# Patient Record
Sex: Male | Born: 1960 | Race: White | Hispanic: No | Smoking: Never smoker
Health system: Southern US, Community
[De-identification: ages and names within clinical notes are randomized; demographics above are authoritative.]

## PROBLEM LIST (undated history)

## (undated) DIAGNOSIS — E119 Type 2 diabetes mellitus without complications: Secondary | ICD-10-CM

## (undated) DIAGNOSIS — Z8673 Personal history of transient ischemic attack (TIA), and cerebral infarction without residual deficits: Secondary | ICD-10-CM

## (undated) DIAGNOSIS — R519 Headache, unspecified: Secondary | ICD-10-CM

## (undated) DIAGNOSIS — I10 Essential (primary) hypertension: Secondary | ICD-10-CM

## (undated) DIAGNOSIS — R51 Headache: Secondary | ICD-10-CM

## (undated) DIAGNOSIS — E782 Mixed hyperlipidemia: Secondary | ICD-10-CM

## (undated) HISTORY — DX: Essential (primary) hypertension: I10

## (undated) HISTORY — PX: CYSTOSCOPY WITH INSERTION OF UROLIFT: SHX6678

## (undated) HISTORY — PX: NO PAST SURGERIES: SHX2092

## (undated) HISTORY — DX: Mixed hyperlipidemia: E78.2

## (undated) HISTORY — PX: FOOT SURGERY: SHX648

## (undated) HISTORY — DX: Type 2 diabetes mellitus without complications: E11.9

## (undated) HISTORY — DX: Personal history of transient ischemic attack (TIA), and cerebral infarction without residual deficits: Z86.73

## (undated) HISTORY — PX: HERNIA REPAIR: SHX51

---

## 2015-07-31 NOTE — H&P (Signed)
  NTS SOAP Note  Vital Signs:  Vitals as of: 07/28/2015: Systolic 143: Diastolic 94: Heart Rate 73: Temp 98.2F (Temporal): Height 71ft 11in: Weight 189Lbs 0 Ounces: BMI 26.36   BMI : 26.36 kg/m2  Subjective: This 55 year old male presents for of need for screening TCS.  Never has had a colonoscopy.  Denies any lower gi complaints.  No family  h/o colon cancer.  Review of Symptoms:  Constitutional:unremarkable   Head:unremarkable Eyes:unremarkable   sinus problems Cardiovascular:  unremarkable Respiratory:unremarkable Gastrointestindyspepsia Genitourinary:unremarkable   joint, neck, and back pain Skin:unremarkable Hematolgic/Lymphatic:unremarkable   Allergic/Immunologic:unremarkable   Past Medical History:  Reviewed  Past Medical History  Surgical History: none Medical Problems: HTN Allergies: nkda Medications: bystolic, spironolactone   Social History:Reviewed  Social History  Preferred Language: English Race:  White Ethnicity: Not Hispanic / Latino Age: 61 year Marital Status:  M Alcohol: no   Smoking Status: Never smoker reviewed on 07/28/2015 Functional Status reviewed on 07/28/2015 ------------------------------------------------ Bathing: Normal Cooking: Normal Dressing: Normal Driving: Normal Eating: Normal Managing Meds: Normal Oral Care: Normal Shopping: Normal Toileting: Normal Transferring: Normal Walking: Normal Cognitive Status reviewed on 07/28/2015 ------------------------------------------------ Attention: Normal Decision Making: Normal Language: Normal Memory: Normal Motor: Normal Perception: Normal Problem Solving: Normal Visual and Spatial: Normal   Family History:Reviewed  Family Health History Mother, Deceased; Diabetes mellitus, unspecified type;  Father, Deceased; History Unknown    Objective Information: General:Well appearing, well nourished in no distress. Heart:RRR, no murmur or gallop.   Normal S1, S2.  No S3, S4.  Lungs:  CTA bilaterally, no wheezes, rhonchi, rales.  Breathing unlabored. Abdomen:Soft, NT/ND, no HSM, no masses. deferred to procedure  Assessment:Need for screening TCS  Diagnoses: V76.51  Z12.11 Screening for malignant neoplasm of colon (Encounter for screening for malignant neoplasm of colon)  Procedures: 14782 - OFFICE OUTPATIENT NEW 20 MINUTES    Plan:  Scheduled for TCS on 08/11/15.   Patient Education:Alternative treatments to surgery were discussed with patient (and family).  Risks and benefits  of procedure including bleeding and perforation were fully explained to the patient (and family) who gave informed consent. Patient/family questions were addressed.  Follow-up:Pending Surgery

## 2015-08-11 ENCOUNTER — Encounter (HOSPITAL_COMMUNITY): Payer: Self-pay | Admitting: *Deleted

## 2015-08-11 ENCOUNTER — Encounter (HOSPITAL_COMMUNITY)
Admission: RE | Disposition: A | Discharge status: Home / Self Care | Payer: Self-pay | Source: Ambulatory Visit | Attending: General Surgery

## 2015-08-11 ENCOUNTER — Ambulatory Visit (HOSPITAL_COMMUNITY)
Admission: RE | Admit: 2015-08-11 | Discharge: 2015-08-11 | Disposition: A | Discharge status: Home / Self Care | Payer: BLUE CROSS/BLUE SHIELD | Source: Ambulatory Visit | Attending: General Surgery | Admitting: General Surgery

## 2015-08-11 DIAGNOSIS — I10 Essential (primary) hypertension: Secondary | ICD-10-CM | POA: Insufficient documentation

## 2015-08-11 DIAGNOSIS — Z79899 Other long term (current) drug therapy: Secondary | ICD-10-CM | POA: Diagnosis not present

## 2015-08-11 DIAGNOSIS — Z1211 Encounter for screening for malignant neoplasm of colon: Secondary | ICD-10-CM | POA: Insufficient documentation

## 2015-08-11 HISTORY — DX: Essential (primary) hypertension: I10

## 2015-08-11 HISTORY — PX: COLONOSCOPY: SHX5424

## 2015-08-11 HISTORY — DX: Headache, unspecified: R51.9

## 2015-08-11 HISTORY — DX: Headache: R51

## 2015-08-11 SURGERY — COLONOSCOPY
Anesthesia: Moderate Sedation

## 2015-08-11 MED ORDER — MIDAZOLAM HCL 5 MG/5ML IJ SOLN
INTRAMUSCULAR | Status: DC | PRN
  Administered 2015-08-11: 3 mg via INTRAVENOUS
  Administered 2015-08-11: 1 mg via INTRAVENOUS

## 2015-08-11 MED ORDER — SIMETHICONE 40 MG/0.6ML PO SUSP
ORAL | Status: AC
  Filled 2015-08-11: qty 30

## 2015-08-11 MED ORDER — MIDAZOLAM HCL 5 MG/5ML IJ SOLN
INTRAMUSCULAR | Status: AC
  Filled 2015-08-11: qty 5

## 2015-08-11 MED ORDER — MEPERIDINE HCL 50 MG/ML IJ SOLN
INTRAMUSCULAR | Status: DC | PRN
  Administered 2015-08-11: 50 mg via INTRAVENOUS

## 2015-08-11 MED ORDER — MEPERIDINE HCL 50 MG/ML IJ SOLN
INTRAMUSCULAR | Status: AC
  Filled 2015-08-11: qty 1

## 2015-08-11 MED ORDER — SODIUM CHLORIDE 0.9 % IV SOLN
INTRAVENOUS | Status: DC
  Administered 2015-08-11: 07:00:00 via INTRAVENOUS

## 2015-08-11 MED ORDER — STERILE WATER FOR IRRIGATION IR SOLN
Status: DC | PRN
  Administered 2015-08-11: 07:00:00

## 2015-08-11 NOTE — Discharge Instructions (Signed)
Colonoscopy, Care After °Refer to this sheet in the next few weeks. These instructions provide you with information on caring for yourself after your procedure. Your health care provider may also give you more specific instructions. Your treatment has been planned according to current medical practices, but problems sometimes occur. Call your health care provider if you have any problems or questions after your procedure. °WHAT TO EXPECT AFTER THE PROCEDURE  °After your procedure, it is typical to have the following: °· A small amount of blood in your stool. °· Moderate amounts of gas and mild abdominal cramping or bloating. °HOME CARE INSTRUCTIONS °· Do not drive, operate machinery, or sign important documents for 24 hours. °· You may shower and resume your regular physical activities, but move at a slower pace for the first 24 hours. °· Take frequent rest periods for the first 24 hours. °· Walk around or put a warm pack on your abdomen to help reduce abdominal cramping and bloating. °· Drink enough fluids to keep your urine clear or pale yellow. °· You may resume your normal diet as instructed by your health care provider. Avoid heavy or fried foods that are hard to digest. °· Avoid drinking alcohol for 24 hours or as instructed by your health care provider. °· Only take over-the-counter or prescription medicines as directed by your health care provider. °· If a tissue sample (biopsy) was taken during your procedure: °¨ Do not take aspirin or blood thinners for 7 days, or as instructed by your health care provider. °¨ Do not drink alcohol for 7 days, or as instructed by your health care provider. °¨ Eat soft foods for the first 24 hours. °SEEK MEDICAL CARE IF: °You have persistent spotting of blood in your stool 2-3 days after the procedure. °SEEK IMMEDIATE MEDICAL CARE IF: °· You have more than a small spotting of blood in your stool. °· You pass large blood clots in your stool. °· Your abdomen is swollen  (distended). °· You have nausea or vomiting. °· You have a fever. °· You have increasing abdominal pain that is not relieved with medicine. °  °This information is not intended to replace advice given to you by your health care provider. Make sure you discuss any questions you have with your health care provider. °  °Document Released: 02/09/2004 Document Revised: 04/17/2013 Document Reviewed: 03/04/2013 °Elsevier Interactive Patient Education ©2016 Elsevier Inc. ° °

## 2015-08-11 NOTE — Interval H&P Note (Signed)
History and Physical Interval Note:  08/11/2015 7:33 AM  Brent Mercer  has presented today for surgery, with the diagnosis of screening  The various methods of treatment have been discussed with the patient and family. After consideration of risks, benefits and other options for treatment, the patient has consented to  Procedure(s): COLONOSCOPY (N/A) as a surgical intervention .  The patient's history has been reviewed, patient examined, no change in status, stable for surgery.  I have reviewed the patient's chart and labs.  Questions were answered to the patient's satisfaction.     Franky Macho A

## 2015-08-11 NOTE — Op Note (Signed)
[facility] [address] [place], [address]   COLONOSCOPY PROCEDURE REPORT     EXAM DATE: 08/22/15  PATIENT NAME:      Brent Mercer, Brent Mercer           MR #:      [id number]  BIRTHDATE:       08/29/1960      VISIT #:     [id number]  ATTENDING:     Franky Macho, MD     STATUS:     outpatient ASSISTANT:  INDICATIONS:  The patient is a 55 yr old male here for a colonoscopy due to average risk patient for colon cancer. PROCEDURE PERFORMED:     Colonoscopy, screening MEDICATIONS:     Demerol 50 mg IV and Versed 4 mg IV ESTIMATED BLOOD LOSS:     None  CONSENT: The patient understands the risks and benefits of the procedure and understands that these risks include, but are not limited to: sedation, allergic reaction, infection, perforation and/or bleeding. Alternative means of evaluation and treatment include, among others: physical exam, x-rays, and/or surgical intervention. The patient elects to proceed with this endoscopic procedure.  DESCRIPTION OF PROCEDURE: During intra-op preparation period all mechanical & medical equipment was checked for proper function. Hand hygiene and appropriate measures for infection prevention was taken. After the risks, benefits and alternatives of the procedure were thoroughly explained, Informed consent was verified, confirmed and timeout was successfully executed by the treatment team. A digital exam revealed no abnormalities of the rectum. The EC-[id number] ([id number]) endoscope was introduced through the anus and advanced to the cecum, which was identified by both the appendix and ileocecal valve. adequate (Trilyte was used) The instrument was then slowly withdrawn as the colon was fully examined.Estimated blood loss is zero unless otherwise noted in this procedure report.   COLON FINDINGS: A normal appearing cecum, ileocecal valve, and appendiceal orifice were identified.  The ascending, transverse, descending, sigmoid colon,  and rectum appeared unremarkable. Retroflexed views revealed no abnormalities. The scope was then completely withdrawn from the patient and the procedure terminated.  SCOPE WITHDRAWAL TIME: 6    ADVERSE EVENTS:      There were no immediate complications.  IMPRESSIONS:     Normal colonoscopy  RECOMMENDATIONS:     Repeat Colonscopy in 10 years. RECALL:  _____________________________ Franky Macho, MD eSigned:  Franky Macho, MD 08-22-2015 7:48 AM   cc:   CPT CODES: ICD CODES:  The ICD and CPT codes recommended by this software are interpretations from the data that the clinical staff has captured with the software.  The verification of the translation of this report to the ICD and CPT codes and modifiers is the sole responsibility of the health care institution and practicing physician where this report was generated.  [employer] will not be held responsible for the validity of the ICD and CPT codes included on this report.  AMA assumes no liability for data contained or not contained herein. CPT is a Publishing rights manager of the [employer].

## 2015-08-13 ENCOUNTER — Encounter (HOSPITAL_COMMUNITY): Payer: Self-pay | Admitting: General Surgery

## 2015-08-18 ENCOUNTER — Ambulatory Visit (INDEPENDENT_AMBULATORY_CARE_PROVIDER_SITE_OTHER): Payer: BLUE CROSS/BLUE SHIELD | Admitting: Neurology

## 2015-08-18 ENCOUNTER — Encounter: Payer: Self-pay | Admitting: Neurology

## 2015-08-18 VITALS — BP 112/72 | HR 70 | Ht 71.0 in | Wt 187.9 lb

## 2015-08-18 DIAGNOSIS — G5712 Meralgia paresthetica, left lower limb: Secondary | ICD-10-CM | POA: Diagnosis not present

## 2015-08-18 DIAGNOSIS — G909 Disorder of the autonomic nervous system, unspecified: Secondary | ICD-10-CM

## 2015-08-18 NOTE — Progress Notes (Signed)
Chart forwarded.  

## 2015-08-18 NOTE — Patient Instructions (Signed)
The sweating may be due to an autonomic nervous system disorder.  If it bothers you, I would have to refer you to an academic center that tests for this.  If it doesn't bother you, I wouldn't really do anything. If the numbness in the thigh becomes a problem, you can try gabapentin.  Otherwise, we can order a nerve study test

## 2015-08-18 NOTE — Progress Notes (Signed)
NEUROLOGY CONSULTATION NOTE  Brent Mercer MRN: [id number] DOB: April 03, 1961  Referring provider: Dr. Sherwood Gambler Primary care provider: Dr. 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  Imaging of brain, cervical and lumbar MRIs reviewed.  Since at least adolescence, he reports that he mostly sweats only on the left side of his body, including face, torso, arm and leg.  He notes occasional myofascial neck pain/tightness.  The left side of his body also becomes more flushed as well.  In addition, he has numbness and sometimes tingling in the left lateral thigh.  He has some mild localized back pain, but no radicular pain down the leg.  He does not wear a heavy belt or tight pants.  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  He doesn't report hyperhidrosis, however.  He underwent MRI on 07/21/15.  MRI of brain without contrast was normal.  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  MRI of lumbar spine was also unremarkable.   PAST MEDICAL HISTORY: Past Medical History  Diagnosis Date  . Hypertension   . Headache     PAST SURGICAL HISTORY: Past Surgical History  Procedure Laterality Date  . No past surgeries    . Colonoscopy N/A 08/11/2015    Procedure: COLONOSCOPY;  Surgeon: Franky Macho, MD;  Location: [facility];  Service: Gastroenterology;  Laterality: N/A;    MEDICATIONS: Current Outpatient Prescriptions on File Prior to Visit  Medication Sig Dispense Refill  . BYSTOLIC 20 MG TABS Take 20 mg by mouth daily.    Marland Kitchen spironolactone (ALDACTONE) 25 MG tablet Take 25 mg by mouth daily. Reported on 08/18/2015     No current facility-administered medications on file prior to visit.    ALLERGIES: No Known Allergies  FAMILY  HISTORY: Family History  Problem Relation Age of Onset  . Colon cancer Other     SOCIAL HISTORY: Social History   Social History  . Marital Status: Married    Spouse Name: N/A  . Number of Children: N/A  . Years of Education: N/A   Occupational History  . Not on file.   Social History Main Topics  . Smoking status: Never Smoker   . Smokeless tobacco: Not on file  . Alcohol Use: No  . Drug Use: No  . Sexual Activity: Not on file   Other Topics Concern  . Not on file   Social History Narrative    REVIEW OF SYSTEMS: Constitutional: No fevers, chills, or sweats, no generalized fatigue, change in appetite Eyes: No visual changes, double vision, eye pain Ear, nose and throat: No hearing loss, ear pain, nasal congestion, sore throat Cardiovascular: No chest pain, palpitations Respiratory:  No shortness of breath at rest or with exertion, wheezes GastrointestinaI: No nausea, vomiting, diarrhea, abdominal pain, fecal incontinence Genitourinary:  No dysuria, urinary retention or frequency Musculoskeletal:  No neck pain, back pain Integumentary: as above Neurological: as above Psychiatric: No depression, insomnia, anxiety Endocrine: No palpitations, fatigue, diaphoresis, mood swings, change in appetite, change in weight, increased thirst Hematologic/Lymphatic:  No anemia, purpura, petechiae. Allergic/Immunologic: no itchy/runny eyes, nasal congestion, recent allergic reactions, rashes  PHYSICAL EXAM: Filed Vitals:   08/18/15 0754  BP: 112/72  Pulse: 70   General: No acute distress.  Patient appears well-groomed.  Head:  Normocephalic/atraumatic Eyes:  fundi unremarkable, without vessel changes, exudates, hemorrhages or papilledema. Neck: supple, no paraspinal tenderness, full range of motion Back: No paraspinal tenderness Heart: regular rate and rhythm Lungs: Clear to auscultation bilaterally. Vascular: No carotid bruits. Neurological Exam: Mental status: alert and  oriented to person, place, and time, recent and remote memory intact, fund of knowledge intact, attention and concentration intact, speech fluent and not dysarthric, language intact. Cranial nerves: CN I: not tested CN II: pupils equal, round and reactive to light, visual fields intact, fundi unremarkable, without vessel changes, exudates, hemorrhages or papilledema. CN III, IV, VI:  full range of motion, no nystagmus, no ptosis CN V: endorses reduced left V1 distribution sensation CN VII: upper and lower face symmetric CN VIII: hearing intact CN IX, X: gag intact, uvula midline CN XI: sternocleidomastoid and trapezius muscles intact CN XII: tongue midline Bulk & Tone: normal, no fasciculations. Motor:  5/5 throughout Sensation:  Decreased pinprick sensation in left lateral thigh.  Very minimal reduced pinprick sensation in left upper extremity. Vibration sensation intact. . Deep Tendon Reflexes:  2+ throughout, toes downgoing. Finger to nose testing:  Without dysmetria.  Heel to shin:  Without dysmetria. Gait:  Normal station and stride.  Able to turn and tandem walk. Romberg negative.  IMPRESSION: He may have autonomic nerve disorder.  Left meralgia paresthetica  PLAN: Specific testing for autonomic neuropathy, such as QSART, would likely be done at an academic center.  Unless it really bothers him, I wouldn't pursue further workup.  At this time, he would like to hold off on further testing. If the thigh pain is uncomfortable, consider gabapentin.  If it becomes more severe and not effective to gabapentin, we can pursue NCV-EMG Follow up as needed.  Thank you for allowing me to take part in the care of this patient.  Shon Millet, DO  CC:  Elfredia Nevins, MD

## 2016-10-19 ENCOUNTER — Ambulatory Visit (INDEPENDENT_AMBULATORY_CARE_PROVIDER_SITE_OTHER): Payer: BLUE CROSS/BLUE SHIELD | Admitting: Urology

## 2016-10-19 DIAGNOSIS — N486 Induration penis plastica: Secondary | ICD-10-CM | POA: Diagnosis not present

## 2016-10-19 DIAGNOSIS — N401 Enlarged prostate with lower urinary tract symptoms: Secondary | ICD-10-CM | POA: Diagnosis not present

## 2016-11-21 DIAGNOSIS — Z1283 Encounter for screening for malignant neoplasm of skin: Secondary | ICD-10-CM | POA: Diagnosis not present

## 2016-11-21 DIAGNOSIS — B360 Pityriasis versicolor: Secondary | ICD-10-CM | POA: Diagnosis not present

## 2016-11-21 DIAGNOSIS — D225 Melanocytic nevi of trunk: Secondary | ICD-10-CM | POA: Diagnosis not present

## 2016-11-21 DIAGNOSIS — D234 Other benign neoplasm of skin of scalp and neck: Secondary | ICD-10-CM | POA: Diagnosis not present

## 2016-11-21 DIAGNOSIS — B078 Other viral warts: Secondary | ICD-10-CM | POA: Diagnosis not present

## 2017-03-10 DIAGNOSIS — M795 Residual foreign body in soft tissue: Secondary | ICD-10-CM | POA: Diagnosis not present

## 2017-04-10 DIAGNOSIS — R2689 Other abnormalities of gait and mobility: Secondary | ICD-10-CM | POA: Diagnosis not present

## 2017-04-10 DIAGNOSIS — R609 Edema, unspecified: Secondary | ICD-10-CM | POA: Diagnosis not present

## 2017-04-10 DIAGNOSIS — M722 Plantar fascial fibromatosis: Secondary | ICD-10-CM | POA: Diagnosis not present

## 2017-04-10 DIAGNOSIS — M79672 Pain in left foot: Secondary | ICD-10-CM | POA: Diagnosis not present

## 2017-04-25 DIAGNOSIS — M722 Plantar fascial fibromatosis: Secondary | ICD-10-CM | POA: Diagnosis not present

## 2017-04-26 ENCOUNTER — Ambulatory Visit (INDEPENDENT_AMBULATORY_CARE_PROVIDER_SITE_OTHER): Payer: BLUE CROSS/BLUE SHIELD | Admitting: Urology

## 2017-04-26 DIAGNOSIS — N486 Induration penis plastica: Secondary | ICD-10-CM | POA: Diagnosis not present

## 2017-04-26 DIAGNOSIS — N401 Enlarged prostate with lower urinary tract symptoms: Secondary | ICD-10-CM

## 2017-04-26 DIAGNOSIS — N5201 Erectile dysfunction due to arterial insufficiency: Secondary | ICD-10-CM

## 2017-05-12 DIAGNOSIS — Z01818 Encounter for other preprocedural examination: Secondary | ICD-10-CM | POA: Diagnosis not present

## 2017-05-29 DIAGNOSIS — M722 Plantar fascial fibromatosis: Secondary | ICD-10-CM | POA: Diagnosis not present

## 2017-06-05 DIAGNOSIS — M722 Plantar fascial fibromatosis: Secondary | ICD-10-CM | POA: Diagnosis not present

## 2017-07-07 DIAGNOSIS — N400 Enlarged prostate without lower urinary tract symptoms: Secondary | ICD-10-CM | POA: Diagnosis not present

## 2017-07-07 DIAGNOSIS — K429 Umbilical hernia without obstruction or gangrene: Secondary | ICD-10-CM | POA: Diagnosis not present

## 2017-07-07 DIAGNOSIS — M722 Plantar fascial fibromatosis: Secondary | ICD-10-CM | POA: Diagnosis not present

## 2017-07-07 DIAGNOSIS — Z6826 Body mass index (BMI) 26.0-26.9, adult: Secondary | ICD-10-CM | POA: Diagnosis not present

## 2017-07-07 DIAGNOSIS — Z0001 Encounter for general adult medical examination with abnormal findings: Secondary | ICD-10-CM | POA: Diagnosis not present

## 2017-07-07 DIAGNOSIS — I10 Essential (primary) hypertension: Secondary | ICD-10-CM | POA: Diagnosis not present

## 2017-07-07 DIAGNOSIS — Z1389 Encounter for screening for other disorder: Secondary | ICD-10-CM | POA: Diagnosis not present

## 2017-07-07 DIAGNOSIS — R7309 Other abnormal glucose: Secondary | ICD-10-CM | POA: Diagnosis not present

## 2017-07-20 DIAGNOSIS — M722 Plantar fascial fibromatosis: Secondary | ICD-10-CM | POA: Diagnosis not present

## 2017-09-06 DIAGNOSIS — M722 Plantar fascial fibromatosis: Secondary | ICD-10-CM | POA: Diagnosis not present

## 2017-10-18 DIAGNOSIS — M9902 Segmental and somatic dysfunction of thoracic region: Secondary | ICD-10-CM | POA: Diagnosis not present

## 2017-10-18 DIAGNOSIS — M546 Pain in thoracic spine: Secondary | ICD-10-CM | POA: Diagnosis not present

## 2017-10-18 DIAGNOSIS — M542 Cervicalgia: Secondary | ICD-10-CM | POA: Diagnosis not present

## 2017-10-18 DIAGNOSIS — M9901 Segmental and somatic dysfunction of cervical region: Secondary | ICD-10-CM | POA: Diagnosis not present

## 2017-10-20 DIAGNOSIS — M546 Pain in thoracic spine: Secondary | ICD-10-CM | POA: Diagnosis not present

## 2017-10-20 DIAGNOSIS — M9901 Segmental and somatic dysfunction of cervical region: Secondary | ICD-10-CM | POA: Diagnosis not present

## 2017-10-20 DIAGNOSIS — M9902 Segmental and somatic dysfunction of thoracic region: Secondary | ICD-10-CM | POA: Diagnosis not present

## 2017-10-20 DIAGNOSIS — M542 Cervicalgia: Secondary | ICD-10-CM | POA: Diagnosis not present

## 2017-10-23 DIAGNOSIS — M9902 Segmental and somatic dysfunction of thoracic region: Secondary | ICD-10-CM | POA: Diagnosis not present

## 2017-10-23 DIAGNOSIS — M542 Cervicalgia: Secondary | ICD-10-CM | POA: Diagnosis not present

## 2017-10-23 DIAGNOSIS — M9901 Segmental and somatic dysfunction of cervical region: Secondary | ICD-10-CM | POA: Diagnosis not present

## 2017-10-23 DIAGNOSIS — M546 Pain in thoracic spine: Secondary | ICD-10-CM | POA: Diagnosis not present

## 2017-10-25 DIAGNOSIS — M542 Cervicalgia: Secondary | ICD-10-CM | POA: Diagnosis not present

## 2017-10-25 DIAGNOSIS — M9901 Segmental and somatic dysfunction of cervical region: Secondary | ICD-10-CM | POA: Diagnosis not present

## 2017-10-25 DIAGNOSIS — M9902 Segmental and somatic dysfunction of thoracic region: Secondary | ICD-10-CM | POA: Diagnosis not present

## 2017-10-25 DIAGNOSIS — M546 Pain in thoracic spine: Secondary | ICD-10-CM | POA: Diagnosis not present

## 2017-11-01 DIAGNOSIS — M9902 Segmental and somatic dysfunction of thoracic region: Secondary | ICD-10-CM | POA: Diagnosis not present

## 2017-11-01 DIAGNOSIS — M542 Cervicalgia: Secondary | ICD-10-CM | POA: Diagnosis not present

## 2017-11-01 DIAGNOSIS — M9901 Segmental and somatic dysfunction of cervical region: Secondary | ICD-10-CM | POA: Diagnosis not present

## 2017-11-01 DIAGNOSIS — M546 Pain in thoracic spine: Secondary | ICD-10-CM | POA: Diagnosis not present

## 2017-12-06 DIAGNOSIS — M722 Plantar fascial fibromatosis: Secondary | ICD-10-CM | POA: Diagnosis not present

## 2017-12-13 ENCOUNTER — Ambulatory Visit (INDEPENDENT_AMBULATORY_CARE_PROVIDER_SITE_OTHER): Payer: BLUE CROSS/BLUE SHIELD | Admitting: Urology

## 2017-12-13 DIAGNOSIS — N486 Induration penis plastica: Secondary | ICD-10-CM

## 2017-12-13 DIAGNOSIS — N5201 Erectile dysfunction due to arterial insufficiency: Secondary | ICD-10-CM | POA: Diagnosis not present

## 2017-12-13 DIAGNOSIS — N401 Enlarged prostate with lower urinary tract symptoms: Secondary | ICD-10-CM

## 2018-06-20 ENCOUNTER — Ambulatory Visit (INDEPENDENT_AMBULATORY_CARE_PROVIDER_SITE_OTHER): Payer: BLUE CROSS/BLUE SHIELD | Admitting: Urology

## 2018-06-20 DIAGNOSIS — N486 Induration penis plastica: Secondary | ICD-10-CM | POA: Diagnosis not present

## 2018-06-20 DIAGNOSIS — N5201 Erectile dysfunction due to arterial insufficiency: Secondary | ICD-10-CM | POA: Diagnosis not present

## 2018-06-20 DIAGNOSIS — N401 Enlarged prostate with lower urinary tract symptoms: Secondary | ICD-10-CM | POA: Diagnosis not present

## 2018-07-20 DIAGNOSIS — E663 Overweight: Secondary | ICD-10-CM | POA: Diagnosis not present

## 2018-07-20 DIAGNOSIS — E782 Mixed hyperlipidemia: Secondary | ICD-10-CM | POA: Diagnosis not present

## 2018-07-20 DIAGNOSIS — Z0001 Encounter for general adult medical examination with abnormal findings: Secondary | ICD-10-CM | POA: Diagnosis not present

## 2018-07-20 DIAGNOSIS — R7309 Other abnormal glucose: Secondary | ICD-10-CM | POA: Diagnosis not present

## 2018-07-20 DIAGNOSIS — Z1389 Encounter for screening for other disorder: Secondary | ICD-10-CM | POA: Diagnosis not present

## 2018-07-20 DIAGNOSIS — Z6827 Body mass index (BMI) 27.0-27.9, adult: Secondary | ICD-10-CM | POA: Diagnosis not present

## 2018-07-20 DIAGNOSIS — R109 Unspecified abdominal pain: Secondary | ICD-10-CM | POA: Diagnosis not present

## 2018-07-20 DIAGNOSIS — I10 Essential (primary) hypertension: Secondary | ICD-10-CM | POA: Diagnosis not present

## 2018-07-20 DIAGNOSIS — N400 Enlarged prostate without lower urinary tract symptoms: Secondary | ICD-10-CM | POA: Diagnosis not present

## 2018-08-22 ENCOUNTER — Ambulatory Visit: Payer: BLUE CROSS/BLUE SHIELD | Admitting: Urology

## 2018-08-22 DIAGNOSIS — N401 Enlarged prostate with lower urinary tract symptoms: Secondary | ICD-10-CM | POA: Diagnosis not present

## 2018-08-22 DIAGNOSIS — N486 Induration penis plastica: Secondary | ICD-10-CM | POA: Diagnosis not present

## 2018-09-07 ENCOUNTER — Other Ambulatory Visit: Payer: Self-pay | Admitting: Urology

## 2018-09-18 NOTE — Patient Instructions (Signed)
Brent Mercer  09/18/2018     @PREFPERIOPPHARMACY @   Your procedure is scheduled on  09/26/2018.  Report to [facility] at  1130   A.M.  Call this number if you have problems the morning of surgery:  [phone]   Remember:  Do not eat or drink after midnight.                      Take these medicines the morning of surgery with A SIP OF WATER  Bystolic, flomax.    Do not wear jewelry, make-up or nail polish.  Do not wear lotions, powders, or perfumes, or deodorant.  Do not shave 48 hours prior to surgery.  Men may shave face and neck.  Do not bring valuables to the hospital.  [facility] is not responsible for any belongings or valuables.  Contacts, dentures or bridgework may not be worn into surgery.  Leave your suitcase in the car.  After surgery it may be brought to your room.  For patients admitted to the hospital, discharge time will be determined by your treatment team.  Patients discharged the day of surgery will not be allowed to drive home.   Name and phone number of your driver:   family Special instructions:  None  Please read over the following fact sheets that you were given. Anesthesia Post-op Instructions and Care and Recovery After Surgery       Cystoscopy  Cystoscopy is a procedure that is used to help diagnose and sometimes treat conditions that affect that lower urinary tract. The lower urinary tract includes the bladder and the tube that drains urine from the bladder out of the body (urethra). Cystoscopy is performed with a thin, tube-shaped instrument with a light and camera at the end (cystoscope). The cystoscope may be hard (rigid) or flexible, depending on the goal of the procedure.The cystoscope is inserted through the urethra, into the bladder. Cystoscopy may be recommended if you have:  Urinary tractinfections that keep coming back (recurring).  Blood in the urine (hematuria).  Loss of bladder control (urinary incontinence) or an  overactive bladder.  Unusual cells found in a urine sample.  A blockage in the urethra.  Painful urination.  An abnormality in the bladder found during an intravenous pyelogram (IVP) or CT scan. Cystoscopy may also be done to remove a sample of tissue to be examined under a microscope (biopsy). Tell a health care provider about:  Any allergies you have.  All medicines you are taking, including vitamins, herbs, eye drops, creams, and over-the-counter medicines.  Any problems you or family members have had with anesthetic medicines.  Any blood disorders you have.  Any surgeries you have had.  Any medical conditions you have.  Whether you are pregnant or may be pregnant. What are the risks? Generally, this is a safe procedure. However, problems may occur, including:  Infection.  Bleeding.  Allergic reactions to medicines.  Damage to other structures or organs. What happens before the procedure?  Ask your health care provider about: ? Changing or stopping your regular medicines. This is especially important if you are taking diabetes medicines or blood thinners. ? Taking medicines such as aspirin and ibuprofen. These medicines can thin your blood. Do not take these medicines before your procedure if your health care provider instructs you not to.  Follow instructions from your health care provider about eating or drinking restrictions.  You may be given antibiotic medicine to  help prevent infection.  You may have an exam or testing, such as X-rays of the bladder, urethra, or kidneys.  You may have urine tests to check for signs of infection.  Plan to have someone take you home after the procedure. What happens during the procedure?  To reduce your risk of infection,your health care team will wash or sanitize their hands.  You will be given one or more of the following: ? A medicine to help you relax (sedative). ? A medicine to numb the area (local  anesthetic).  The area around the opening of your urethra will be cleaned.  The cystoscope will be passed through your urethra into your bladder.  Germ-free (sterile)fluid will flow through the cystoscope to fill your bladder. The fluid will stretch your bladder so that your surgeon can clearly examine your bladder walls.  The cystoscope will be removed and your bladder will be emptied. The procedure may vary among health care providers and hospitals. What happens after the procedure?  You may have some soreness or pain in your abdomen and urethra. Medicines will be available to help you.  You may have some blood in your urine.  Do not drive for 24 hours if you received a sedative. This information is not intended to replace advice given to you by your health care provider. Make sure you discuss any questions you have with your health care provider. Document Released: 06/24/2000 Document Revised: 04/07/2017 Document Reviewed: 05/14/2015 Elsevier Interactive Patient Education  2019 Elsevier Inc. General Anesthesia, Adult, Care After This sheet gives you information about how to care for yourself after your procedure. Your health care provider may also give you more specific instructions. If you have problems or questions, contact your health care provider. What can I expect after the procedure? After the procedure, the following side effects are common:  Pain or discomfort at the IV site.  Nausea.  Vomiting.  Sore throat.  Trouble concentrating.  Feeling cold or chills.  Weak or tired.  Sleepiness and fatigue.  Soreness and body aches. These side effects can affect parts of the body that were not involved in surgery. Follow these instructions at home:  For at least 24 hours after the procedure:  Have a responsible adult stay with you. It is important to have someone help care for you until you are awake and alert.  Rest as needed.  Do not: ? Participate in  activities in which you could fall or become injured. ? Drive. ? Use heavy machinery. ? Drink alcohol. ? Take sleeping pills or medicines that cause drowsiness. ? Make important decisions or sign legal documents. ? Take care of children on your own. Eating and drinking  Follow any instructions from your health care provider about eating or drinking restrictions.  When you feel hungry, start by eating small amounts of foods that are soft and easy to digest (bland), such as toast. Gradually return to your regular diet.  Drink enough fluid to keep your urine pale yellow.  If you vomit, rehydrate by drinking water, juice, or clear broth. General instructions  If you have sleep apnea, surgery and certain medicines can increase your risk for breathing problems. Follow instructions from your health care provider about wearing your sleep device: ? Anytime you are sleeping, including during daytime naps. ? While taking prescription pain medicines, sleeping medicines, or medicines that make you drowsy.  Return to your normal activities as told by your health care provider. Ask your health care provider what  activities are safe for you.  Take over-the-counter and prescription medicines only as told by your health care provider.  If you smoke, do not smoke without supervision.  Keep all follow-up visits as told by your health care provider. This is important. Contact a health care provider if:  You have nausea or vomiting that does not get better with medicine.  You cannot eat or drink without vomiting.  You have pain that does not get better with medicine.  You are unable to pass urine.  You develop a skin rash.  You have a fever.  You have redness around your IV site that gets worse. Get help right away if:  You have difficulty breathing.  You have chest pain.  You have blood in your urine or stool, or you vomit blood. Summary  After the procedure, it is common to have a  sore throat or nausea. It is also common to feel tired.  Have a responsible adult stay with you for the first 24 hours after general anesthesia. It is important to have someone help care for you until you are awake and alert.  When you feel hungry, start by eating small amounts of foods that are soft and easy to digest (bland), such as toast. Gradually return to your regular diet.  Drink enough fluid to keep your urine pale yellow.  Return to your normal activities as told by your health care provider. Ask your health care provider what activities are safe for you. This information is not intended to replace advice given to you by your health care provider. Make sure you discuss any questions you have with your health care provider. Document Released: 10/03/2000 Document Revised: 02/10/2017 Document Reviewed: 02/10/2017 Elsevier Interactive Patient Education  2019 [employer].

## 2018-09-20 ENCOUNTER — Other Ambulatory Visit: Payer: Self-pay

## 2018-09-20 ENCOUNTER — Encounter (HOSPITAL_COMMUNITY): Payer: Self-pay

## 2018-09-20 ENCOUNTER — Encounter (HOSPITAL_COMMUNITY)
Admission: RE | Admit: 2018-09-20 | Discharge: 2018-09-20 | Disposition: A | Discharge status: Home / Self Care | Payer: BLUE CROSS/BLUE SHIELD | Source: Ambulatory Visit | Attending: Urology | Admitting: Urology

## 2018-09-20 DIAGNOSIS — Z01818 Encounter for other preprocedural examination: Secondary | ICD-10-CM | POA: Insufficient documentation

## 2018-09-20 LAB — BASIC METABOLIC PANEL
ANION GAP: 9 (ref 5–15)
BUN: 14 mg/dL (ref 6–20)
CO2: 22 mmol/L (ref 22–32)
Calcium: 9.2 mg/dL (ref 8.9–10.3)
Chloride: 106 mmol/L (ref 98–111)
Creatinine, Ser: 0.9 mg/dL (ref 0.61–1.24)
GFR calc Af Amer: >60 mL/min (ref >60)
GLUCOSE: 165 mg/dL — AB (ref 70–99)
POTASSIUM: 3.7 mmol/L (ref 3.5–5.1)
Sodium: 137 mmol/L (ref 135–145)

## 2018-09-26 ENCOUNTER — Encounter (HOSPITAL_COMMUNITY)
Admission: RE | Disposition: A | Discharge status: Home / Self Care | Payer: Self-pay | Source: Home / Self Care | Attending: Urology

## 2018-09-26 ENCOUNTER — Ambulatory Visit (HOSPITAL_COMMUNITY)
Admission: RE | Admit: 2018-09-26 | Discharge: 2018-09-26 | Disposition: A | Discharge status: Home / Self Care | Payer: BLUE CROSS/BLUE SHIELD | Attending: Urology | Admitting: Urology

## 2018-09-26 ENCOUNTER — Encounter (HOSPITAL_COMMUNITY): Payer: Self-pay | Admitting: *Deleted

## 2018-09-26 ENCOUNTER — Other Ambulatory Visit: Payer: Self-pay

## 2018-09-26 SURGERY — CYSTOSCOPY WITH INSERTION OF UROLIFT
Anesthesia: General

## 2018-09-26 MED ORDER — CEFAZOLIN SODIUM-DEXTROSE 2-4 GM/100ML-% IV SOLN
2.0000 g | INTRAVENOUS | Status: DC
  Filled 2018-09-26: qty 100

## 2018-09-26 NOTE — OR Nursing (Signed)
Due to  Mandatory instructions this case was cancelled .  IV  D/cd .  Patient spoke with Dr. Ronne Binning ,  " Marchelle Folks from office is to contact patient to come back to his office  For futher instructions for surgery at another time. Patient discharged to home.  Accompanied by wife.

## 2018-09-28 ENCOUNTER — Ambulatory Visit: Payer: BLUE CROSS/BLUE SHIELD | Admitting: Urology

## 2018-10-01 DIAGNOSIS — R350 Frequency of micturition: Secondary | ICD-10-CM | POA: Diagnosis not present

## 2018-10-01 DIAGNOSIS — N401 Enlarged prostate with lower urinary tract symptoms: Secondary | ICD-10-CM | POA: Diagnosis not present

## 2018-10-03 ENCOUNTER — Other Ambulatory Visit: Payer: Self-pay

## 2018-10-03 ENCOUNTER — Ambulatory Visit (INDEPENDENT_AMBULATORY_CARE_PROVIDER_SITE_OTHER): Payer: BLUE CROSS/BLUE SHIELD | Admitting: Urology

## 2018-10-03 DIAGNOSIS — N401 Enlarged prostate with lower urinary tract symptoms: Secondary | ICD-10-CM | POA: Diagnosis not present

## 2018-10-18 DIAGNOSIS — N401 Enlarged prostate with lower urinary tract symptoms: Secondary | ICD-10-CM | POA: Diagnosis not present

## 2018-10-18 DIAGNOSIS — R350 Frequency of micturition: Secondary | ICD-10-CM | POA: Diagnosis not present

## 2019-01-17 DIAGNOSIS — R3915 Urgency of urination: Secondary | ICD-10-CM | POA: Diagnosis not present

## 2019-01-17 DIAGNOSIS — N401 Enlarged prostate with lower urinary tract symptoms: Secondary | ICD-10-CM | POA: Diagnosis not present

## 2019-01-21 DIAGNOSIS — B078 Other viral warts: Secondary | ICD-10-CM | POA: Diagnosis not present

## 2019-01-21 DIAGNOSIS — X32XXXD Exposure to sunlight, subsequent encounter: Secondary | ICD-10-CM | POA: Diagnosis not present

## 2019-01-21 DIAGNOSIS — D225 Melanocytic nevi of trunk: Secondary | ICD-10-CM | POA: Diagnosis not present

## 2019-01-21 DIAGNOSIS — L570 Actinic keratosis: Secondary | ICD-10-CM | POA: Diagnosis not present

## 2019-01-21 DIAGNOSIS — L821 Other seborrheic keratosis: Secondary | ICD-10-CM | POA: Diagnosis not present

## 2019-04-01 DIAGNOSIS — Z6827 Body mass index (BMI) 27.0-27.9, adult: Secondary | ICD-10-CM | POA: Diagnosis not present

## 2019-04-01 DIAGNOSIS — I10 Essential (primary) hypertension: Secondary | ICD-10-CM | POA: Diagnosis not present

## 2019-04-01 DIAGNOSIS — E7849 Other hyperlipidemia: Secondary | ICD-10-CM | POA: Diagnosis not present

## 2019-04-01 DIAGNOSIS — E663 Overweight: Secondary | ICD-10-CM | POA: Diagnosis not present

## 2019-04-25 DIAGNOSIS — N5201 Erectile dysfunction due to arterial insufficiency: Secondary | ICD-10-CM | POA: Diagnosis not present

## 2019-04-25 DIAGNOSIS — N401 Enlarged prostate with lower urinary tract symptoms: Secondary | ICD-10-CM | POA: Diagnosis not present

## 2019-04-25 DIAGNOSIS — R3915 Urgency of urination: Secondary | ICD-10-CM | POA: Diagnosis not present

## 2019-06-04 DIAGNOSIS — C44629 Squamous cell carcinoma of skin of left upper limb, including shoulder: Secondary | ICD-10-CM | POA: Diagnosis not present

## 2019-06-04 DIAGNOSIS — B078 Other viral warts: Secondary | ICD-10-CM | POA: Diagnosis not present

## 2019-06-04 DIAGNOSIS — L820 Inflamed seborrheic keratosis: Secondary | ICD-10-CM | POA: Diagnosis not present

## 2019-11-15 DIAGNOSIS — M722 Plantar fascial fibromatosis: Secondary | ICD-10-CM | POA: Diagnosis not present

## 2019-11-15 DIAGNOSIS — M79672 Pain in left foot: Secondary | ICD-10-CM | POA: Diagnosis not present

## 2019-12-13 DIAGNOSIS — M79672 Pain in left foot: Secondary | ICD-10-CM | POA: Diagnosis not present

## 2019-12-13 DIAGNOSIS — M722 Plantar fascial fibromatosis: Secondary | ICD-10-CM | POA: Diagnosis not present

## 2020-01-01 ENCOUNTER — Other Ambulatory Visit: Payer: Self-pay | Admitting: Podiatry

## 2020-01-20 DIAGNOSIS — M545 Low back pain: Secondary | ICD-10-CM | POA: Diagnosis not present

## 2020-01-20 DIAGNOSIS — M9903 Segmental and somatic dysfunction of lumbar region: Secondary | ICD-10-CM | POA: Diagnosis not present

## 2020-01-20 DIAGNOSIS — M9902 Segmental and somatic dysfunction of thoracic region: Secondary | ICD-10-CM | POA: Diagnosis not present

## 2020-01-20 DIAGNOSIS — M546 Pain in thoracic spine: Secondary | ICD-10-CM | POA: Diagnosis not present

## 2020-01-21 DIAGNOSIS — M9903 Segmental and somatic dysfunction of lumbar region: Secondary | ICD-10-CM | POA: Diagnosis not present

## 2020-01-21 DIAGNOSIS — M545 Low back pain: Secondary | ICD-10-CM | POA: Diagnosis not present

## 2020-01-21 DIAGNOSIS — M546 Pain in thoracic spine: Secondary | ICD-10-CM | POA: Diagnosis not present

## 2020-01-21 DIAGNOSIS — M9902 Segmental and somatic dysfunction of thoracic region: Secondary | ICD-10-CM | POA: Diagnosis not present

## 2020-01-28 DIAGNOSIS — M79672 Pain in left foot: Secondary | ICD-10-CM | POA: Diagnosis not present

## 2020-01-28 DIAGNOSIS — M722 Plantar fascial fibromatosis: Secondary | ICD-10-CM | POA: Diagnosis not present

## 2020-01-29 DIAGNOSIS — M546 Pain in thoracic spine: Secondary | ICD-10-CM | POA: Diagnosis not present

## 2020-01-29 DIAGNOSIS — M9902 Segmental and somatic dysfunction of thoracic region: Secondary | ICD-10-CM | POA: Diagnosis not present

## 2020-01-29 DIAGNOSIS — M545 Low back pain: Secondary | ICD-10-CM | POA: Diagnosis not present

## 2020-01-29 DIAGNOSIS — M9903 Segmental and somatic dysfunction of lumbar region: Secondary | ICD-10-CM | POA: Diagnosis not present

## 2020-02-05 DIAGNOSIS — M546 Pain in thoracic spine: Secondary | ICD-10-CM | POA: Diagnosis not present

## 2020-02-05 DIAGNOSIS — M545 Low back pain: Secondary | ICD-10-CM | POA: Diagnosis not present

## 2020-02-05 DIAGNOSIS — M9902 Segmental and somatic dysfunction of thoracic region: Secondary | ICD-10-CM | POA: Diagnosis not present

## 2020-02-05 DIAGNOSIS — M9903 Segmental and somatic dysfunction of lumbar region: Secondary | ICD-10-CM | POA: Diagnosis not present

## 2020-02-07 NOTE — Patient Instructions (Signed)
Brent Mercer  02/07/2020     @PREFPERIOPPHARMACY @   Your procedure is scheduled on  02/12/2020.  Report to 04/13/2020 at  [phone]  A.M.  Call this number if you have problems the morning of surgery:  [phone]   Remember:  Do not eat or drink after midnight.                          Take these medicines the morning of surgery with A SIP OF WATER  Bystolic, flomax.    Do not wear jewelry, make-up or nail polish.  Do not wear lotions, powders, or perfumes. Please wear deodorant and brush your teeth.  Do not shave 48 hours prior to surgery.  Men may shave face and neck.  Do not bring valuables to the hospital.  [facility] is not responsible for any belongings or valuables.  Contacts, dentures or bridgework may not be worn into surgery.  Leave your suitcase in the car.  After surgery it may be brought to your room.  For patients admitted to the hospital, discharge time will be determined by your treatment team.  Patients discharged the day of surgery will not be allowed to drive home.   Name and phone number of your driver:   family Special instructions:  DO NOT smoke the morning of your procedure.  Please read over the following fact sheets that you were given. Anesthesia Post-op Instructions and Care and Recovery After Surgery       Open Plantar Fasciotomy, Care After This sheet gives you information about how to care for yourself after your procedure. Your health care provider may also give you more specific instructions. If you have problems or questions, contact your health care provider. What can I expect after the procedure? After the procedure, it is common to have:  Foot pain and stiffness.  Swelling in the incision area. Follow these instructions at home: Incision care   Follow instructions from your health care provider about how to take care of your incision. Make sure you: ? Wash your hands with soap and water before and after you change your  bandage (dressing). If soap and water are not available, use hand sanitizer. ? Change your dressing as told by your health care provider. ? Leave stitches (sutures), skin glue, or adhesive strips in place. These skin closures may need to be in place for 2 weeks or longer. If adhesive strip edges start to loosen and curl up, you may trim the loose edges. Do not remove adhesive strips completely unless your health care provider tells you to do that.  Check your incision area every day for signs of infection. Check for: ? More redness, swelling, or pain. ? Fluid or blood. ? Warmth. ? Pus or a bad smell. If you have a splint, boot, or shoe:  Wear it as told by your health care provider. Remove it only as told by your health care provider.  Loosen it if your toes tingle, become numb, or turn cold and blue.  Keep it clean.  If the splint, boot, or shoe is not waterproof: ? Do not let it get wet. ? Cover it with a watertight covering when you take a bath or shower. Managing pain, stiffness, and swelling   If directed, put ice on the affected foot. ? If you have a removable splint, boot, or shoe, remove it as told by your health care  provider. ? Put ice in a plastic bag. ? Place a towel between your skin and the bag. ? Leave the ice on for 20 minutes, 2-3 times a day.  Move your toes often to reduce stiffness and swelling.  Raise (elevate) your foot above the level of your heart while you are sitting or lying down. Bathing  Do not take baths, swim, or use a hot tub until your health care provider approves. Ask your health care provider if you may take showers. You may only be allowed to take sponge baths.  Keep the dressing dry until your health care provider says it can be removed. Activity  Do not use the affected foot to support (bear) your body weight until your health care provider says that you can. Use crutches as told by your health care provider.  Do not do any activity that  causes foot pain.  Return to your normal activities as told by your health care provider. Ask your health care provider what activities are safe for you. Driving  Do not drive for 24 hours if you were given a sedative during your procedure.  Ask your health care provider: ? If the medicine prescribed to you requires you to avoid driving or using heavy machinery. ? When it is safe to drive if you have a splint, boot, or shoe on your foot. General instructions  Take over-the-counter and prescription medicines only as told by your health care provider.  Keep all follow-up visits as told by your health care provider. This is important. Contact a health care provider if:  You have a fever.  You have pain or swelling in your foot that is getting worse.  You have more redness, swelling, or pain around your incision.  You have fluid or blood coming from your incision.  Your incision feels warm to the touch.  You have pus or a bad smell coming from your incision.  Your foot is numb. Get help right away if:  You have pain, warmth, and swelling in your calf area.  You have chest pain.  You have trouble breathing. Summary  Do not bear weight on your foot until your health care provider says that it is safe to do so.  Follow instructions from your health care provider about how to take care of your incision.  Take over-the-counter and prescription medicines only as told by your health care provider.  Keep all follow-up visits as told by your health care provider. This is important. This information is not intended to replace advice given to you by your health care provider. Make sure you discuss any questions you have with your health care provider. Document Revised: 10/18/2018 Document Reviewed: 06/13/2018 Elsevier Patient Education  2020 [employer]  Monitored Anesthesia Care, Care After These instructions provide you with information about caring for yourself after your  procedure. Your health care provider may also give you more specific instructions. Your treatment has been planned according to current medical practices, but problems sometimes occur. Call your health care provider if you have any problems or questions after your procedure. What can I expect after the procedure? After your procedure, you may:  Feel sleepy for several hours.  Feel clumsy and have poor balance for several hours.  Feel forgetful about what happened after the procedure.  Have poor judgment for several hours.  Feel nauseous or vomit.  Have a sore throat if you had a breathing tube during the procedure. Follow these instructions at home: For at  least 24 hours after the procedure:      Have a responsible adult stay with you. It is important to have someone help care for you until you are awake and alert.  Rest as needed.  Do not: ? Participate in activities in which you could fall or become injured. ? Drive. ? Use heavy machinery. ? Drink alcohol. ? Take sleeping pills or medicines that cause drowsiness. ? Make important decisions or sign legal documents. ? Take care of children on your own. Eating and drinking  Follow the diet that is recommended by your health care provider.  If you vomit, drink water, juice, or soup when you can drink without vomiting.  Make sure you have little or no nausea before eating solid foods. General instructions  Take over-the-counter and prescription medicines only as told by your health care provider.  If you have sleep apnea, surgery and certain medicines can increase your risk for breathing problems. Follow instructions from your health care provider about wearing your sleep device: ? Anytime you are sleeping, including during daytime naps. ? While taking prescription pain medicines, sleeping medicines, or medicines that make you drowsy.  If you smoke, do not smoke without supervision.  Keep all follow-up visits as told by  your health care provider. This is important. Contact a health care provider if:  You keep feeling nauseous or you keep vomiting.  You feel light-headed.  You develop a rash.  You have a fever. Get help right away if:  You have trouble breathing. Summary  For several hours after your procedure, you may feel sleepy and have poor judgment.  Have a responsible adult stay with you for at least 24 hours or until you are awake and alert. This information is not intended to replace advice given to you by your health care provider. Make sure you discuss any questions you have with your health care provider. Document Revised: 09/25/2017 Document Reviewed: 10/18/2015 Elsevier Patient Education  2020 [employer]. How to Use Chlorhexidine for Bathing Chlorhexidine gluconate (CHG) is a germ-killing (antiseptic) solution that is used to clean the skin. It can get rid of the bacteria that normally live on the skin and can keep them away for about 24 hours. To clean your skin with CHG, you may be given:  A CHG solution to use in the shower or as part of a sponge bath.  A prepackaged cloth that contains CHG. Cleaning your skin with CHG may help lower the risk for infection:  While you are staying in the intensive care unit of the hospital.  If you have a vascular access, such as a central line, to provide short-term or long-term access to your veins.  If you have a catheter to drain urine from your bladder.  If you are on a ventilator. A ventilator is a machine that helps you breathe by moving air in and out of your lungs.  After surgery. What are the risks? Risks of using CHG include:  A skin reaction.  Hearing loss, if CHG gets in your ears.  Eye injury, if CHG gets in your eyes and is not rinsed out.  The CHG product catching fire. Make sure that you avoid smoking and flames after applying CHG to your skin. Do not use CHG:  If you have a chlorhexidine allergy or have previously  reacted to chlorhexidine.  On babies younger than 32 months of age. How to use CHG solution  Use CHG only as told by your health care  provider, and follow the instructions on the label.  Use the full amount of CHG as directed. Usually, this is one bottle. During a shower Follow these steps when using CHG solution during a shower (unless your health care provider gives you different instructions): 1. Start the shower. 2. Use your normal soap and shampoo to wash your face and hair. 3. Turn off the shower or move out of the shower stream. 4. Pour the CHG onto a clean washcloth. Do not use any type of brush or rough-edged sponge. 5. Starting at your neck, lather your body down to your toes. Make sure you follow these instructions: ? If you will be having surgery, pay special attention to the part of your body where you will be having surgery. Scrub this area for at least 1 minute. ? Do not use CHG on your head or face. If the solution gets into your ears or eyes, rinse them well with water. ? Avoid your genital area. ? Avoid any areas of skin that have broken skin, cuts, or scrapes. ? Scrub your back and under your arms. Make sure to wash skin folds. 6. Let the lather sit on your skin for 1-2 minutes or as long as told by your health care provider. 7. Thoroughly rinse your entire body in the shower. Make sure that all body creases and crevices are rinsed well. 8. Dry off with a clean towel. Do not put any substances on your body afterward--such as powder, lotion, or perfume--unless you are told to do so by your health care provider. Only use lotions that are recommended by the manufacturer. 9. Put on clean clothes or pajamas. 10. If it is the night before your surgery, sleep in clean sheets.  During a sponge bath Follow these steps when using CHG solution during a sponge bath (unless your health care provider gives you different instructions): 1. Use your normal soap and shampoo to wash your  face and hair. 2. Pour the CHG onto a clean washcloth. 3. Starting at your neck, lather your body down to your toes. Make sure you follow these instructions: ? If you will be having surgery, pay special attention to the part of your body where you will be having surgery. Scrub this area for at least 1 minute. ? Do not use CHG on your head or face. If the solution gets into your ears or eyes, rinse them well with water. ? Avoid your genital area. ? Avoid any areas of skin that have broken skin, cuts, or scrapes. ? Scrub your back and under your arms. Make sure to wash skin folds. 4. Let the lather sit on your skin for 1-2 minutes or as long as told by your health care provider. 5. Using a different clean, wet washcloth, thoroughly rinse your entire body. Make sure that all body creases and crevices are rinsed well. 6. Dry off with a clean towel. Do not put any substances on your body afterward--such as powder, lotion, or perfume--unless you are told to do so by your health care provider. Only use lotions that are recommended by the manufacturer. 7. Put on clean clothes or pajamas. 8. If it is the night before your surgery, sleep in clean sheets. How to use CHG prepackaged cloths  Only use CHG cloths as told by your health care provider, and follow the instructions on the label.  Use the CHG cloth on clean, dry skin.  Do not use the CHG cloth on your head or face unless  your health care provider tells you to.  When washing with the CHG cloth: ? Avoid your genital area. ? Avoid any areas of skin that have broken skin, cuts, or scrapes. Before surgery Follow these steps when using a CHG cloth to clean before surgery (unless your health care provider gives you different instructions): 1. Using the CHG cloth, vigorously scrub the part of your body where you will be having surgery. Scrub using a back-and-forth motion for 3 minutes. The area on your body should be completely wet with CHG when you  are done scrubbing. 2. Do not rinse. Discard the cloth and let the area air-dry. Do not put any substances on the area afterward, such as powder, lotion, or perfume. 3. Put on clean clothes or pajamas. 4. If it is the night before your surgery, sleep in clean sheets.  For general bathing Follow these steps when using CHG cloths for general bathing (unless your health care provider gives you different instructions). 1. Use a separate CHG cloth for each area of your body. Make sure you wash between any folds of skin and between your fingers and toes. Wash your body in the following order, switching to a new cloth after each step: ? The front of your neck, shoulders, and chest. ? Both of your arms, under your arms, and your hands. ? Your stomach and groin area, avoiding the genitals. ? Your right leg and foot. ? Your left leg and foot. ? The back of your neck, your back, and your buttocks. 2. Do not rinse. Discard the cloth and let the area air-dry. Do not put any substances on your body afterward--such as powder, lotion, or perfume--unless you are told to do so by your health care provider. Only use lotions that are recommended by the manufacturer. 3. Put on clean clothes or pajamas. Contact a health care provider if:  Your skin gets irritated after scrubbing.  You have questions about using your solution or cloth. Get help right away if:  Your eyes become very red or swollen.  Your eyes itch badly.  Your skin itches badly and is red or swollen.  Your hearing changes.  You have trouble seeing.  You have swelling or tingling in your mouth or throat.  You have trouble breathing.  You swallow any chlorhexidine. Summary  Chlorhexidine gluconate (CHG) is a germ-killing (antiseptic) solution that is used to clean the skin. Cleaning your skin with CHG may help to lower your risk for infection.  You may be given CHG to use for bathing. It may be in a bottle or in a prepackaged cloth  to use on your skin. Carefully follow your health care provider's instructions and the instructions on the product label.  Do not use CHG if you have a chlorhexidine allergy.  Contact your health care provider if your skin gets irritated after scrubbing. This information is not intended to replace advice given to you by your health care provider. Make sure you discuss any questions you have with your health care provider. Document Revised: 09/13/2018 Document Reviewed: 05/25/2017 Elsevier Patient Education  2020 [employer].

## 2020-02-10 ENCOUNTER — Other Ambulatory Visit: Payer: Self-pay

## 2020-02-10 ENCOUNTER — Encounter (HOSPITAL_COMMUNITY): Payer: Self-pay

## 2020-02-10 ENCOUNTER — Encounter (HOSPITAL_COMMUNITY)
Admission: RE | Admit: 2020-02-10 | Discharge: 2020-02-10 | Disposition: A | Discharge status: Home / Self Care | Payer: BC Managed Care – PPO | Source: Ambulatory Visit | Attending: Podiatry | Admitting: Podiatry

## 2020-02-10 ENCOUNTER — Other Ambulatory Visit (HOSPITAL_COMMUNITY)
Admission: RE | Admit: 2020-02-10 | Discharge: 2020-02-10 | Disposition: A | Discharge status: Home / Self Care | Payer: BC Managed Care – PPO | Source: Ambulatory Visit | Attending: Podiatry | Admitting: Podiatry

## 2020-02-10 DIAGNOSIS — I10 Essential (primary) hypertension: Secondary | ICD-10-CM | POA: Diagnosis not present

## 2020-02-10 DIAGNOSIS — Z20822 Contact with and (suspected) exposure to covid-19: Secondary | ICD-10-CM | POA: Diagnosis not present

## 2020-02-10 DIAGNOSIS — Z01818 Encounter for other preprocedural examination: Secondary | ICD-10-CM | POA: Insufficient documentation

## 2020-02-10 LAB — SARS CORONAVIRUS 2 (TAT 6-24 HRS): SARS Coronavirus 2: NEGATIVE

## 2020-02-12 ENCOUNTER — Encounter (HOSPITAL_COMMUNITY): Payer: Self-pay | Admitting: Podiatry

## 2020-02-12 ENCOUNTER — Encounter (HOSPITAL_COMMUNITY)
Admission: RE | Disposition: A | Discharge status: Home / Self Care | Payer: Self-pay | Source: Home / Self Care | Attending: Podiatry

## 2020-02-12 ENCOUNTER — Ambulatory Visit (HOSPITAL_COMMUNITY): Payer: BC Managed Care – PPO | Admitting: Anesthesiology

## 2020-02-12 ENCOUNTER — Ambulatory Visit (HOSPITAL_COMMUNITY)
Admission: RE | Admit: 2020-02-12 | Discharge: 2020-02-12 | Disposition: A | Discharge status: Home / Self Care | Payer: BC Managed Care – PPO | Attending: Podiatry | Admitting: Podiatry

## 2020-02-12 DIAGNOSIS — M79672 Pain in left foot: Secondary | ICD-10-CM | POA: Diagnosis not present

## 2020-02-12 DIAGNOSIS — M722 Plantar fascial fibromatosis: Secondary | ICD-10-CM | POA: Insufficient documentation

## 2020-02-12 DIAGNOSIS — I10 Essential (primary) hypertension: Secondary | ICD-10-CM | POA: Diagnosis not present

## 2020-02-12 DIAGNOSIS — Z9889 Other specified postprocedural states: Secondary | ICD-10-CM

## 2020-02-12 DIAGNOSIS — K219 Gastro-esophageal reflux disease without esophagitis: Secondary | ICD-10-CM | POA: Diagnosis not present

## 2020-02-12 HISTORY — PX: PLANTAR FASCIA RELEASE: SHX2239

## 2020-02-12 SURGERY — RELEASE, FASCIA, PLANTAR
Anesthesia: General | Laterality: Left

## 2020-02-12 MED ORDER — FENTANYL CITRATE (PF) 100 MCG/2ML IJ SOLN
INTRAMUSCULAR | Status: AC
  Filled 2020-02-12: qty 2

## 2020-02-12 MED ORDER — MEPERIDINE HCL 50 MG/ML IJ SOLN: 6.2500 mg | INTRAMUSCULAR | Status: DC | PRN

## 2020-02-12 MED ORDER — PROMETHAZINE HCL 25 MG/ML IJ SOLN: 6.2500 mg | INTRAMUSCULAR | Status: DC | PRN

## 2020-02-12 MED ORDER — LACTATED RINGERS IV SOLN: INTRAVENOUS | Status: DC | PRN

## 2020-02-12 MED ORDER — CHLORHEXIDINE GLUCONATE 0.12 % MT SOLN
15.0000 mL | Freq: Once | OROMUCOSAL | Status: AC
  Administered 2020-02-12: 15 mL via OROMUCOSAL
  Filled 2020-02-12: qty 15

## 2020-02-12 MED ORDER — LACTATED RINGERS IV SOLN: Freq: Once | INTRAVENOUS | Status: AC

## 2020-02-12 MED ORDER — ORAL CARE MOUTH RINSE: 15.0000 mL | Freq: Once | OROMUCOSAL | Status: AC

## 2020-02-12 MED ORDER — HYDROMORPHONE HCL 1 MG/ML IJ SOLN: 0.2500 mg | INTRAMUSCULAR | Status: DC | PRN

## 2020-02-12 MED ORDER — BUPIVACAINE HCL (PF) 0.5 % IJ SOLN
INTRAMUSCULAR | Status: AC
  Filled 2020-02-12: qty 30

## 2020-02-12 MED ORDER — LIDOCAINE HCL (CARDIAC) PF 100 MG/5ML IV SOSY
PREFILLED_SYRINGE | INTRAVENOUS | Status: DC | PRN
  Administered 2020-02-12: 50 mg via INTRAVENOUS

## 2020-02-12 MED ORDER — MIDAZOLAM HCL 2 MG/2ML IJ SOLN
INTRAMUSCULAR | Status: AC
  Filled 2020-02-12: qty 2

## 2020-02-12 MED ORDER — ONDANSETRON HCL 4 MG/2ML IJ SOLN
INTRAMUSCULAR | Status: DC | PRN
  Administered 2020-02-12: 4 mg via INTRAVENOUS

## 2020-02-12 MED ORDER — ONDANSETRON HCL 4 MG/2ML IJ SOLN
INTRAMUSCULAR | Status: AC
  Filled 2020-02-12: qty 2

## 2020-02-12 MED ORDER — MIDAZOLAM HCL 5 MG/5ML IJ SOLN
INTRAMUSCULAR | Status: DC | PRN
  Administered 2020-02-12: 2 mg via INTRAVENOUS

## 2020-02-12 MED ORDER — PROPOFOL 10 MG/ML IV BOLUS
INTRAVENOUS | Status: AC
  Filled 2020-02-12: qty 40

## 2020-02-12 MED ORDER — PROPOFOL 500 MG/50ML IV EMUL
INTRAVENOUS | Status: DC | PRN
  Administered 2020-02-12: 100 ug/kg/min via INTRAVENOUS

## 2020-02-12 MED ORDER — PROPOFOL 10 MG/ML IV BOLUS
INTRAVENOUS | Status: DC | PRN
  Administered 2020-02-12: 40 mg via INTRAVENOUS

## 2020-02-12 MED ORDER — BUPIVACAINE HCL (PF) 0.5 % IJ SOLN
INTRAMUSCULAR | Status: DC | PRN
  Administered 2020-02-12: 20 mL

## 2020-02-12 MED ORDER — LIDOCAINE HCL (PF) 1 % IJ SOLN
INTRAMUSCULAR | Status: AC
  Filled 2020-02-12: qty 30

## 2020-02-12 MED ORDER — CLINDAMYCIN PHOSPHATE 600 MG/50ML IV SOLN
600.0000 mg | Freq: Once | INTRAVENOUS | Status: AC
  Administered 2020-02-12: 600 mg via INTRAVENOUS
  Filled 2020-02-12: qty 50

## 2020-02-12 MED ORDER — SODIUM CHLORIDE 0.9 % IR SOLN
Status: DC | PRN
  Administered 2020-02-12: 1000 mL

## 2020-02-12 MED ORDER — FENTANYL CITRATE (PF) 100 MCG/2ML IJ SOLN
INTRAMUSCULAR | Status: DC | PRN
  Administered 2020-02-12: 50 ug via INTRAVENOUS

## 2020-02-12 MED ORDER — LIDOCAINE 2% (20 MG/ML) 5 ML SYRINGE
INTRAMUSCULAR | Status: AC
  Filled 2020-02-12: qty 10

## 2020-02-12 SURGICAL SUPPLY — 44 items
APL SKNCLS STERI-STRIP NONHPOA (GAUZE/BANDAGES/DRESSINGS) ×1
BANDAGE ACE 3X5.8 VEL STRL LF (GAUZE/BANDAGES/DRESSINGS) ×3 IMPLANT
BANDAGE CONFORM 3  STR LF (GAUZE/BANDAGES/DRESSINGS) ×2 IMPLANT
BANDAGE ELASTIC 4 VELCRO NS (GAUZE/BANDAGES/DRESSINGS) ×3 IMPLANT
BANDAGE ESMARK 4X12 BL STRL LF (DISPOSABLE) ×1 IMPLANT
BANDAGE GAUZE ELAST BULKY 4 IN (GAUZE/BANDAGES/DRESSINGS) ×3 IMPLANT
BENZOIN TINCTURE PRP APPL 2/3 (GAUZE/BANDAGES/DRESSINGS) ×3 IMPLANT
BLADE SURG 15 STRL LF DISP TIS (BLADE) ×1 IMPLANT
BLADE SURG 15 STRL SS (BLADE) ×3
BNDG CMPR 12X4 ELC STRL LF (DISPOSABLE) ×1
BNDG CMPR STD VLCR NS LF 5.8X4 (GAUZE/BANDAGES/DRESSINGS) ×1
BNDG CONFORM 3 STRL LF (GAUZE/BANDAGES/DRESSINGS) ×3 IMPLANT
BNDG ELASTIC 4X5.8 VLCR NS LF (GAUZE/BANDAGES/DRESSINGS) ×3 IMPLANT
BNDG ESMARK 4X12 BLUE STRL LF (DISPOSABLE) ×3
BNDG GAUZE ELAST 4 BULKY (GAUZE/BANDAGES/DRESSINGS) ×2 IMPLANT
BOOT STEPPER DURA MED (SOFTGOODS) ×3 IMPLANT
CLOSURE STERI STRIP 1/2 X4 (GAUZE/BANDAGES/DRESSINGS) ×3 IMPLANT
CLOSURE STERI-STRIP 1/2X4 (GAUZE/BANDAGES/DRESSINGS) ×1
CLOTH BEACON ORANGE TIMEOUT ST (SAFETY) ×3 IMPLANT
CLSR STERI-STRIP ANTIMIC 1/2X4 (GAUZE/BANDAGES/DRESSINGS) ×2 IMPLANT
COVER LIGHT HANDLE STERIS (MISCELLANEOUS) ×6 IMPLANT
COVER MAYO STAND XLG (MISCELLANEOUS) ×3 IMPLANT
COVER WAND RF STERILE (DRAPES) ×3 IMPLANT
CUFF TOURN SGL QUICK 18X4 (TOURNIQUET CUFF) ×3 IMPLANT
DECANTER SPIKE VIAL GLASS SM (MISCELLANEOUS) ×6 IMPLANT
DRSG ADAPTIC 3X8 NADH LF (GAUZE/BANDAGES/DRESSINGS) ×3 IMPLANT
DURAPREP 26ML APPLICATOR (WOUND CARE) ×3 IMPLANT
ELECT REM PT RETURN 9FT ADLT (ELECTROSURGICAL) ×3
ELECTRODE REM PT RTRN 9FT ADLT (ELECTROSURGICAL) ×1 IMPLANT
GAUZE SPONGE 4X4 12PLY STRL (GAUZE/BANDAGES/DRESSINGS) ×3 IMPLANT
GLOVE BIOGEL PI IND STRL 7.0 (GLOVE) ×3 IMPLANT
GLOVE BIOGEL PI IND STRL 7.5 (GLOVE) ×1 IMPLANT
GLOVE BIOGEL PI INDICATOR 7.0 (GLOVE) ×6
GLOVE BIOGEL PI INDICATOR 7.5 (GLOVE) ×2
GOWN STRL REUS W/TWL LRG LVL3 (GOWN DISPOSABLE) ×6 IMPLANT
KIT TURNOVER KIT A (KITS) ×3 IMPLANT
MANIFOLD NEPTUNE II (INSTRUMENTS) ×3 IMPLANT
NEEDLE HYPO 27GX1-1/4 (NEEDLE) ×6 IMPLANT
NS IRRIG 1000ML POUR BTL (IV SOLUTION) ×3 IMPLANT
PACK BASIC LIMB (CUSTOM PROCEDURE TRAY) ×3 IMPLANT
PAD ARMBOARD 7.5X6 YLW CONV (MISCELLANEOUS) ×3 IMPLANT
SET BASIN LINEN APH (SET/KITS/TRAYS/PACK) ×3 IMPLANT
SUT PROLENE 4 0 PS 2 18 (SUTURE) ×3 IMPLANT
SYR CONTROL 10ML LL (SYRINGE) ×6 IMPLANT

## 2020-02-12 NOTE — Op Note (Signed)
OPERATIVE NOTE  DATE OF PROCEDURE 02/12/2020  SURGEON Dallas Schimke, DPM  ASSISTANT SURGEON None  OR STAFF Circulator: Jacqualine Mau, RN Scrub Person: Illene Silver, CST Circulator Assistant: Lennox Pippins, RN   PREOPERATIVE DIAGNOSIS 1.  Plantar fasciitis, left foot 2.  Pain, left foot  POSTOPERATIVE DIAGNOSIS Same  PROCEDURE Plantar fasciotomy, left foot  ANESTHESIA Monitor Anesthesia Care   HEMOSTASIS Pneumatic ankle tourniquet set at 250 mmHg  ESTIMATED BLOOD LOSS Minimal (<5 cc)  MATERIALS USED None  INJECTABLES 0.5% Marcaine plain  PATHOLOGY None  COMPLICATIONS None  INDICATIONS:  Chronic left heel pain nonresponsive to nonsurgical care and [place] procedure.  DESCRIPTION OF THE PROCEDURE:  The patient was brought to the operating room and placed on the operative table in the supine position.  A pneumatic ankle tourniquet was applied to the left lower extremity.  A time out was performed.  Following sedation, the surgical site was anesthetized with 0.5% Marcaine plain.  The foot was prepped, scrubbed, and draped in the usual sterile technique.  A second time out was performed.  The foot was elevated, exsanguinated and the pneumatic ankle tourniquet inflated to 250 mmHg.    Attention was directed to the plantar aspect of the left foot.  A transverse incision was made using a #15 blade just distal to the plantar heel pain.  Blunt dissection was continued deep using a hemostat.  All bleeders were cauterized as necessary.  The plantar fascia was identified.  The medial band of the plantar fascia was released using a #15 blade.  The underlying plantar musculature was visualized.    The surgical wound was irrigated with copious amounts of normal saline.  The subcutaneous tissue was re approximated with 4-0 Vicryl.  The skin was re approximated with 4-0 Prolene.  Wound closure was reinforced with Steri-Strips.  A sterile compressive dressing was applied.   The pneumatic ankle tourniquet was released and a prompt hyperemic response was noted to all digits of the operative foot.    The patient tolerated the procedure well.  The patient was transferred to PACU with vital signs stable.

## 2020-02-12 NOTE — Discharge Instructions (Signed)
These instructions will give you an idea of what to expect after surgery and how to manage issues that may arise before your first post op office visit.  Pain Management Pain is best managed by "staying ahead" of it. If pain gets out of control, it is difficult to get it back under control. Local anesthesia that lasts 6-8 hours is used to numb the foot and decrease pain.  For the best pain control, take the pain medication every 4 hours for the first 2 days post op. On the third day pain medication can be taken as needed.   Post Op Nausea Nausea is common after surgery, so it is managed proactively.  If prescribed, use the prescribed nausea medication regularly for the first 2 days post op.  Bandages Do not worry if there is blood on the bandage. What looks like a lot of blood on the bandage is actually a small amount. Blood on the dressing spreads out as it is absorbed by the gauze, the same way a drop of water spreads out on a paper towel.  If the bandages feel wet or dry, stiff and uncomfortable, call the office during office hours and we will schedule a time for you to have the bandage changed.  Unless you are specifically told otherwise, we will do the first bandage change in the office.  Keep your bandage dry. If the bandage becomes wet or soiled, notify the office and we will schedule a time to change the bandage.  Activity It is best to spend most of the first 2 days after surgery lying down with the foot elevated above the level of your heart. You may put weight on your heel while wearing the CAM Walker (black boot).   You may only get up to go to the restroom.  Driving Do not drive until you are able to respond in an emergency (i.e. slam on the brakes). This usually occurs after the bone has healed - 6 to 8 weeks.  Call the Office If you have a fever over 101F.  If you have increasing pain after the initial post op pain has settled down.  If you have increasing redness, swelling,  or drainage.  If you have any questions or concerns.   

## 2020-02-12 NOTE — H&P (Signed)
HISTORY AND PHYSICAL INTERVAL NOTE:  02/12/2020  7:15 AM  Brent Mercer  has presented today for surgery, with the diagnosis of plantar fasciitis of left foot.  The various methods of treatment have been discussed with the patient.  No guarantees were given.  After consideration of risks, benefits and other options for treatment, the patient has consented to surgery.  I have reviewed the patients' chart and labs.    Patient Vitals for the past 24 hrs:  BP Temp Temp src Pulse Resp SpO2  02/12/20 0655 (!) 129/102 98.4 F (36.9 C) Oral 70 18 94 %    A history and physical examination was performed in my office.  The patient was reexamined.  There have been no changes to this history and physical examination.  Dallas Schimke, DPM

## 2020-02-12 NOTE — Transfer of Care (Signed)
Immediate Anesthesia Transfer of Care Note  Patient: Brent Mercer  Procedure(s) Performed: OPEN PLANTAR FASCIOTOMY LEFT FOOT (Left )  Patient Location: PACU  Anesthesia Type:MAC  Level of Consciousness: awake, alert  and oriented  Airway & Oxygen Therapy: Patient Spontanous Breathing and Patient connected to nasal cannula oxygen  Post-op Assessment: Report given to RN and Post -op Vital signs reviewed and stable  Post vital signs: Reviewed and stable  Last Vitals:  Vitals Value Taken Time  BP    Temp    Pulse    Resp    SpO2      Last Pain:  Vitals:   02/12/20 0655  TempSrc: Oral  PainSc: 5       Patients Stated Pain Goal: 5 (27/51/70 0174)  Complications: No complications documented.

## 2020-02-12 NOTE — Brief Op Note (Signed)
BRIEF OPERATIVE NOTE  DATE OF PROCEDURE 02/12/2020  SURGEON Dallas Schimke, DPM  ASSISTANT SURGEON None  OR STAFF Circulator: Jacqualine Mau, RN Scrub Person: Illene Silver, CST Circulator Assistant: Lennox Pippins, RN   PREOPERATIVE DIAGNOSIS 1.  Plantar fasciitis, left foot 2.  Pain, left foot  POSTOPERATIVE DIAGNOSIS Same  PROCEDURE Plantar fasciotomy, left foot  ANESTHESIA Monitor Anesthesia Care   HEMOSTASIS Pneumatic ankle tourniquet set at 250 mmHg  ESTIMATED BLOOD LOSS Minimal (<5 cc)  MATERIALS USED None  INJECTABLES 0.5% Marcaine plain  PATHOLOGY None  COMPLICATIONS None

## 2020-02-12 NOTE — Anesthesia Preprocedure Evaluation (Signed)
Anesthesia Evaluation  Patient identified by MRN, date of birth, ID band Patient awake    Reviewed: Allergy & Precautions, NPO status , Patient's Chart, lab work & pertinent test results  History of Anesthesia Complications Negative for: history of anesthetic complications  Airway Mallampati: II  TM Distance: >3 FB Neck ROM: Full    Dental  (+) Teeth Intact, Dental Advisory Given Lost Filling :   Pulmonary neg pulmonary ROS,    Pulmonary exam normal breath sounds clear to auscultation       Cardiovascular Exercise Tolerance: Good hypertension, Pt. on medications Normal cardiovascular exam Rhythm:Regular Rate:Normal     Neuro/Psych  Headaches, negative psych ROS   GI/Hepatic Neg liver ROS, GERD (mild)  Controlled,  Endo/Other  negative endocrine ROS  Renal/GU negative Renal ROS  negative genitourinary   Musculoskeletal negative musculoskeletal ROS (+)   Abdominal   Peds negative pediatric ROS (+)  Hematology negative hematology ROS (+)   Anesthesia Other Findings   Reproductive/Obstetrics negative OB ROS                           Anesthesia Physical Anesthesia Plan  ASA: II  Anesthesia Plan: General   Post-op Pain Management:    Induction: Intravenous  PONV Risk Score and Plan: TIVA  Airway Management Planned: Nasal Cannula, Natural Airway and Simple Face Mask  Additional Equipment:   Intra-op Plan:   Post-operative Plan:   Informed Consent: I have reviewed the patients History and Physical, chart, labs and discussed the procedure including the risks, benefits and alternatives for the proposed anesthesia with the patient or authorized representative who has indicated his/her understanding and acceptance.     Dental advisory given  Plan Discussed with:   Anesthesia Plan Comments:        Anesthesia Quick Evaluation

## 2020-02-12 NOTE — Anesthesia Postprocedure Evaluation (Signed)
Anesthesia Post Note  Patient: Brent Mercer  Procedure(s) Performed: OPEN PLANTAR FASCIOTOMY LEFT FOOT (Left )  Patient location during evaluation: PACU Anesthesia Type: General Level of consciousness: awake and alert and oriented Pain management: pain level controlled Vital Signs Assessment: post-procedure vital signs reviewed and stable Respiratory status: spontaneous breathing Cardiovascular status: blood pressure returned to baseline and stable Postop Assessment: no apparent nausea or vomiting Anesthetic complications: no   No complications documented.   Last Vitals:  Vitals:   02/12/20 0843 02/12/20 0847  BP: 115/80 129/90  Pulse: 65 65  Resp: 15 18  Temp:  36.6 C  SpO2: 93% 95%    Last Pain:  Vitals:   02/12/20 0848  TempSrc:   PainSc: 0-No pain                 Sherrie George Anh Mangano

## 2020-02-13 ENCOUNTER — Encounter (HOSPITAL_COMMUNITY): Payer: Self-pay | Admitting: Podiatry

## 2020-02-14 DIAGNOSIS — Z4889 Encounter for other specified surgical aftercare: Secondary | ICD-10-CM | POA: Diagnosis not present

## 2020-04-03 DIAGNOSIS — K429 Umbilical hernia without obstruction or gangrene: Secondary | ICD-10-CM | POA: Diagnosis not present

## 2020-04-03 DIAGNOSIS — Z6826 Body mass index (BMI) 26.0-26.9, adult: Secondary | ICD-10-CM | POA: Diagnosis not present

## 2020-04-03 DIAGNOSIS — I10 Essential (primary) hypertension: Secondary | ICD-10-CM | POA: Diagnosis not present

## 2020-04-03 DIAGNOSIS — N400 Enlarged prostate without lower urinary tract symptoms: Secondary | ICD-10-CM | POA: Diagnosis not present

## 2020-04-03 DIAGNOSIS — E7849 Other hyperlipidemia: Secondary | ICD-10-CM | POA: Diagnosis not present

## 2020-04-03 DIAGNOSIS — Z1389 Encounter for screening for other disorder: Secondary | ICD-10-CM | POA: Diagnosis not present

## 2020-04-03 DIAGNOSIS — R7309 Other abnormal glucose: Secondary | ICD-10-CM | POA: Diagnosis not present

## 2020-04-03 DIAGNOSIS — Z0001 Encounter for general adult medical examination with abnormal findings: Secondary | ICD-10-CM | POA: Diagnosis not present

## 2020-05-20 ENCOUNTER — Other Ambulatory Visit: Payer: Self-pay

## 2020-05-20 ENCOUNTER — Emergency Department (HOSPITAL_COMMUNITY): Payer: BC Managed Care – PPO

## 2020-05-20 ENCOUNTER — Encounter (HOSPITAL_COMMUNITY): Payer: Self-pay

## 2020-05-20 ENCOUNTER — Inpatient Hospital Stay (HOSPITAL_COMMUNITY)
Admission: EM | Admit: 2020-05-20 | Discharge: 2020-05-22 | DRG: 177 | Disposition: A | Discharge status: Home Health | Payer: BC Managed Care – PPO | Attending: Family Medicine | Admitting: Family Medicine

## 2020-05-20 DIAGNOSIS — J1282 Pneumonia due to coronavirus disease 2019: Secondary | ICD-10-CM | POA: Diagnosis not present

## 2020-05-20 DIAGNOSIS — R0602 Shortness of breath: Secondary | ICD-10-CM | POA: Diagnosis not present

## 2020-05-20 DIAGNOSIS — R7303 Prediabetes: Secondary | ICD-10-CM | POA: Diagnosis present

## 2020-05-20 DIAGNOSIS — T380X5A Adverse effect of glucocorticoids and synthetic analogues, initial encounter: Secondary | ICD-10-CM | POA: Diagnosis not present

## 2020-05-20 DIAGNOSIS — I10 Essential (primary) hypertension: Secondary | ICD-10-CM | POA: Diagnosis not present

## 2020-05-20 DIAGNOSIS — R069 Unspecified abnormalities of breathing: Secondary | ICD-10-CM | POA: Diagnosis not present

## 2020-05-20 DIAGNOSIS — U071 COVID-19: Principal | ICD-10-CM | POA: Diagnosis present

## 2020-05-20 DIAGNOSIS — R059 Cough, unspecified: Secondary | ICD-10-CM | POA: Diagnosis not present

## 2020-05-20 DIAGNOSIS — R739 Hyperglycemia, unspecified: Secondary | ICD-10-CM | POA: Diagnosis present

## 2020-05-20 DIAGNOSIS — Z79899 Other long term (current) drug therapy: Secondary | ICD-10-CM

## 2020-05-20 DIAGNOSIS — R7989 Other specified abnormal findings of blood chemistry: Secondary | ICD-10-CM | POA: Diagnosis not present

## 2020-05-20 DIAGNOSIS — J189 Pneumonia, unspecified organism: Secondary | ICD-10-CM | POA: Diagnosis not present

## 2020-05-20 LAB — CBC WITH DIFFERENTIAL/PLATELET
Abs Immature Granulocytes: 0.01 10*3/uL (ref 0.00–0.07)
Basophils Absolute: 0 10*3/uL (ref 0.0–0.1)
Basophils Relative: 0 %
Eosinophils Absolute: 0 10*3/uL (ref 0.0–0.5)
Eosinophils Relative: 0 %
HCT: 52 % (ref 39.0–52.0)
Hemoglobin: 17 g/dL (ref 13.0–17.0)
Immature Granulocytes: 0 %
Lymphocytes Relative: 24 %
Lymphs Abs: 0.9 10*3/uL (ref 0.7–4.0)
MCH: 30.6 pg (ref 26.0–34.0)
MCHC: 32.7 g/dL (ref 30.0–36.0)
MCV: 93.7 fL (ref 80.0–100.0)
Monocytes Absolute: 0.3 10*3/uL (ref 0.1–1.0)
Monocytes Relative: 7 %
Neutro Abs: 2.7 10*3/uL (ref 1.7–7.7)
Neutrophils Relative %: 69 %
Platelets: 139 10*3/uL — ABNORMAL LOW (ref 150–400)
RBC: 5.55 MIL/uL (ref 4.22–5.81)
RDW: 12.6 % (ref 11.5–15.5)
WBC: 3.9 10*3/uL — ABNORMAL LOW (ref 4.0–10.5)
nRBC: 0 % (ref 0.0–0.2)

## 2020-05-20 LAB — COMPREHENSIVE METABOLIC PANEL
ALT: 46 U/L — ABNORMAL HIGH (ref 0–44)
AST: 57 U/L — ABNORMAL HIGH (ref 15–41)
Albumin: 3.7 g/dL (ref 3.5–5.0)
Alkaline Phosphatase: 58 U/L (ref 38–126)
Anion gap: 12 (ref 5–15)
BUN: 16 mg/dL (ref 6–20)
CO2: 27 mmol/L (ref 22–32)
Calcium: 8.4 mg/dL — ABNORMAL LOW (ref 8.9–10.3)
Chloride: 96 mmol/L — ABNORMAL LOW (ref 98–111)
Creatinine, Ser: 0.94 mg/dL (ref 0.61–1.24)
GFR, Estimated: >60 mL/min (ref >60)
Glucose, Bld: 125 mg/dL — ABNORMAL HIGH (ref 70–99)
Potassium: 3.7 mmol/L (ref 3.5–5.1)
Sodium: 135 mmol/L (ref 135–145)
Total Bilirubin: 0.8 mg/dL (ref 0.3–1.2)
Total Protein: 7.3 g/dL (ref 6.5–8.1)

## 2020-05-20 LAB — RESPIRATORY PANEL BY RT PCR (FLU A&B, COVID)
Influenza A by PCR: NEGATIVE
Influenza B by PCR: NEGATIVE
SARS Coronavirus 2 by RT PCR: POSITIVE — AB

## 2020-05-20 LAB — LACTIC ACID, PLASMA: Lactic Acid, Venous: 1.7 mmol/L (ref 0.5–1.9)

## 2020-05-20 LAB — D-DIMER, QUANTITATIVE: D-Dimer, Quant: 0.81 ug/mL-FEU — ABNORMAL HIGH (ref 0.00–0.50)

## 2020-05-20 LAB — FIBRINOGEN: Fibrinogen: 600 mg/dL — ABNORMAL HIGH (ref 210–475)

## 2020-05-20 LAB — C-REACTIVE PROTEIN: CRP: 4.5 mg/dL — ABNORMAL HIGH (ref <1.0)

## 2020-05-20 LAB — PROCALCITONIN: Procalcitonin: <0.1 ng/mL

## 2020-05-20 LAB — TRIGLYCERIDES: Triglycerides: 193 mg/dL — ABNORMAL HIGH (ref <150)

## 2020-05-20 LAB — LACTATE DEHYDROGENASE: LDH: 336 U/L — ABNORMAL HIGH (ref 98–192)

## 2020-05-20 LAB — FERRITIN: Ferritin: 1400 ng/mL — ABNORMAL HIGH (ref 24–336)

## 2020-05-20 MED ORDER — HYDROCOD POLST-CPM POLST ER 10-8 MG/5ML PO SUER: 5.0000 mL | Freq: Two times a day (BID) | ORAL | Status: DC | PRN

## 2020-05-20 MED ORDER — SODIUM CHLORIDE 0.9 % IV SOLN: 200.0000 mg | Freq: Once | INTRAVENOUS | Status: DC

## 2020-05-20 MED ORDER — ONDANSETRON HCL 4 MG PO TABS: 4.0000 mg | ORAL_TABLET | Freq: Four times a day (QID) | ORAL | Status: DC | PRN

## 2020-05-20 MED ORDER — ONDANSETRON HCL 4 MG/2ML IJ SOLN: 4.0000 mg | Freq: Four times a day (QID) | INTRAMUSCULAR | Status: DC | PRN

## 2020-05-20 MED ORDER — SODIUM CHLORIDE 0.9 % IV SOLN
100.0000 mg | Freq: Every day | INTRAVENOUS | Status: DC
  Administered 2020-05-21 – 2020-05-22 (×2): 100 mg via INTRAVENOUS
  Filled 2020-05-20 (×2): qty 20

## 2020-05-20 MED ORDER — NEBIVOLOL HCL 10 MG PO TABS
10.0000 mg | ORAL_TABLET | Freq: Every day | ORAL | Status: DC
  Administered 2020-05-21 – 2020-05-22 (×2): 10 mg via ORAL
  Filled 2020-05-20 (×4): qty 1

## 2020-05-20 MED ORDER — METHYLPREDNISOLONE SODIUM SUCC 125 MG IJ SOLR
1.0000 mg/kg | Freq: Two times a day (BID) | INTRAMUSCULAR | Status: DC
  Administered 2020-05-20 – 2020-05-22 (×4): 86.875 mg via INTRAVENOUS
  Filled 2020-05-20 (×4): qty 2

## 2020-05-20 MED ORDER — ACETAMINOPHEN 325 MG PO TABS
650.0000 mg | ORAL_TABLET | Freq: Four times a day (QID) | ORAL | Status: DC | PRN
  Administered 2020-05-20: 650 mg via ORAL
  Filled 2020-05-20: qty 2

## 2020-05-20 MED ORDER — GUAIFENESIN-DM 100-10 MG/5ML PO SYRP
10.0000 mL | ORAL_SOLUTION | ORAL | Status: DC | PRN
  Administered 2020-05-20 – 2020-05-21 (×2): 10 mL via ORAL
  Filled 2020-05-20 (×2): qty 10

## 2020-05-20 MED ORDER — IBUPROFEN 800 MG PO TABS
800.0000 mg | ORAL_TABLET | Freq: Once | ORAL | Status: AC
  Administered 2020-05-20: 800 mg via ORAL
  Filled 2020-05-20: qty 1

## 2020-05-20 MED ORDER — ENOXAPARIN SODIUM 40 MG/0.4ML ~~LOC~~ SOLN
40.0000 mg | SUBCUTANEOUS | Status: DC
  Administered 2020-05-20 – 2020-05-21 (×2): 40 mg via SUBCUTANEOUS
  Filled 2020-05-20 (×2): qty 0.4

## 2020-05-20 MED ORDER — SODIUM CHLORIDE 0.9 % IV SOLN
100.0000 mg | INTRAVENOUS | Status: AC
  Administered 2020-05-20 (×2): 100 mg via INTRAVENOUS
  Filled 2020-05-20 (×2): qty 20

## 2020-05-20 MED ORDER — SODIUM CHLORIDE 0.9 % IV SOLN: 100.0000 mg | Freq: Every day | INTRAVENOUS | Status: DC

## 2020-05-20 MED ORDER — PANTOPRAZOLE SODIUM 40 MG PO TBEC
40.0000 mg | DELAYED_RELEASE_TABLET | Freq: Every day | ORAL | Status: DC
  Administered 2020-05-20 – 2020-05-22 (×3): 40 mg via ORAL
  Filled 2020-05-20 (×3): qty 1

## 2020-05-20 MED ORDER — PREDNISONE 20 MG PO TABS: 50.0000 mg | ORAL_TABLET | Freq: Every day | ORAL | Status: DC

## 2020-05-20 NOTE — ED Provider Notes (Signed)
[facility] EMERGENCY DEPARTMENT Provider Note   CSN: [id number] Arrival date & time: 05/20/20  1321     History Chief Complaint  Patient presents with  . Shortness of Breath    Diagnosed with Covid on 11/2. Complaining of increased SOB, productive cough, fever and generalized weakness and discomfort.     Brent Mercer is a 59 y.o. male.  59 year old male with history of hypertension brought in by EMS for known Covid with worsening shortness of breath.  Patient states symptoms started on November 1, had a positive home test followed by positive PCR test at urgent care.  Patient states that he has had progressively worsening body aches, cough, shortness of breath.  Has had nausea and vomiting, last episode 2 days ago, denies diarrhea.  Patient is not vaccinated as COVID-19.  On arrival, EMS reports O2 sat of 91%, placed on nasal cannula with some improvement.  Brent Mercer was evaluated in Emergency Department on 05/20/2020 for the symptoms described in the history of present illness. He was evaluated in the context of the global COVID-19 pandemic, which necessitated consideration that the patient might be at risk for infection with the SARS-CoV-2 virus that causes COVID-19. Institutional protocols and algorithms that pertain to the evaluation of patients at risk for COVID-19 are in a state of rapid change based on information released by regulatory bodies including the CDC and federal and state organizations. These policies and algorithms were followed during the patient's care in the ED.         Past Medical History:  Diagnosis Date  . Headache   . Hypertension     Patient Active Problem List   Diagnosis Date Noted  . COVID-19 virus infection 05/20/2020    Past Surgical History:  Procedure Laterality Date  . COLONOSCOPY N/A 08/11/2015   Procedure: COLONOSCOPY;  Surgeon: Franky Macho, MD;  Location: [facility];  Service: Gastroenterology;  Laterality: N/A;  . CYSTOSCOPY  WITH INSERTION OF UROLIFT    . FOOT SURGERY Left   . HERNIA REPAIR    . NO PAST SURGERIES    . PLANTAR FASCIA RELEASE Left 02/12/2020   Procedure: OPEN PLANTAR FASCIOTOMY LEFT FOOT;  Surgeon: Ferman Hamming, DPM;  Location: [facility] ORS;  Service: Podiatry;  Laterality: Left;       Family History  Problem Relation Age of Onset  . Colon cancer Other     Social History   Tobacco Use  . Smoking status: Never Smoker  . Smokeless tobacco: Never Used  Vaping Use  . Vaping Use: Never used  Substance Use Topics  . Alcohol use: No  . Drug use: No    Home Medications Prior to Admission medications   Medication Sig Start Date End Date Taking? Authorizing Provider  BYSTOLIC 10 MG tablet Take 10 mg by mouth daily.  07/08/15  Yes [provider]    Allergies    Latex  Review of Systems   Review of Systems  Constitutional: Positive for chills, fatigue and fever.  HENT: Positive for congestion.   Respiratory: Positive for cough and shortness of breath.   Cardiovascular: Negative for chest pain.  Gastrointestinal: Positive for nausea and vomiting. Negative for abdominal pain, constipation and diarrhea.  Genitourinary: Negative for dysuria.  Musculoskeletal: Positive for arthralgias and myalgias.  Skin: Negative for rash and wound.  Allergic/Immunologic: Negative for immunocompromised state.  Neurological: Positive for weakness.  Hematological: Negative for adenopathy.  Psychiatric/Behavioral: Negative for confusion.  All other systems reviewed and are negative.  Physical Exam Updated Vital Signs BP 122/82   Pulse 73   Temp (!) 102 F (38.9 C) (Oral)   Resp (!) 21   Ht 5\' 11"  (1.803 m)   Wt 87.1 kg   SpO2 95%   BMI 26.78 kg/m   Physical Exam Vitals and nursing note reviewed.  Constitutional:      General: He is not in acute distress.    Appearance: He is well-developed. He is not diaphoretic.     Comments: Appears to feel unwell although not toxic.  HENT:       Head: Normocephalic and atraumatic.  Cardiovascular:     Rate and Rhythm: Normal rate and regular rhythm.  Pulmonary:     Effort: Tachypnea present.     Breath sounds: Normal breath sounds. No decreased breath sounds.  Chest:     Chest wall: No tenderness.  Abdominal:     Palpations: Abdomen is soft.     Tenderness: There is no abdominal tenderness.  Musculoskeletal:     Right lower leg: No edema.     Left lower leg: No edema.  Skin:    General: Skin is warm and dry.     Findings: No erythema or rash.  Neurological:     Mental Status: He is alert and oriented to person, place, and time.  Psychiatric:        Behavior: Behavior normal.     ED Results / Procedures / Treatments   Labs (all labs ordered are listed, but only abnormal results are displayed) Labs Reviewed  RESPIRATORY PANEL BY RT PCR (FLU A&B, COVID) - Abnormal; Notable for the following components:      Result Value   SARS Coronavirus 2 by RT PCR POSITIVE (*)    All other components within normal limits  CBC WITH DIFFERENTIAL/PLATELET - Abnormal; Notable for the following components:   WBC 3.9 (*)    Platelets 139 (*)    All other components within normal limits  COMPREHENSIVE METABOLIC PANEL - Abnormal; Notable for the following components:   Chloride 96 (*)    Glucose, Bld 125 (*)    Calcium 8.4 (*)    AST 57 (*)    ALT 46 (*)    All other components within normal limits  D-DIMER, QUANTITATIVE (NOT AT [facility]) - Abnormal; Notable for the following components:   D-Dimer, Quant 0.81 (*)    All other components within normal limits  LACTATE DEHYDROGENASE - Abnormal; Notable for the following components:   LDH 336 (*)    All other components within normal limits  FERRITIN - Abnormal; Notable for the following components:   Ferritin 1,400 (*)    All other components within normal limits  TRIGLYCERIDES - Abnormal; Notable for the following components:   Triglycerides 193 (*)    All other components within  normal limits  FIBRINOGEN - Abnormal; Notable for the following components:   Fibrinogen 600 (*)    All other components within normal limits  C-REACTIVE PROTEIN - Abnormal; Notable for the following components:   CRP 4.5 (*)    All other components within normal limits  CULTURE, BLOOD (ROUTINE X 2)  CULTURE, BLOOD (ROUTINE X 2)  LACTIC ACID, PLASMA  PROCALCITONIN    EKG EKG Interpretation  Date/Time:  Wednesday May 20 2020 13:43:08 EST Ventricular Rate:  81 PR Interval:    QRS Duration: 87 QT Interval:  363 QTC Calculation: 422 R Axis:   55 Text Interpretation: Sinus rhythm Low voltage, precordial leads  T wave abnormality Abnormal ECG Confirmed by Gerhard Munch [phone]) on 05/20/2020 1:47:06 PM   Radiology DG Chest Port 1 View  Result Date: 05/20/2020 CLINICAL DATA:  Shortness of breath.  COVID-19 positive EXAM: PORTABLE CHEST 1 VIEW COMPARISON:  None. FINDINGS: Patchy airspace opacity is noted in each mid and lower lung region. No consolidation. Lungs elsewhere clear. Heart is upper normal in size with pulmonary vascularity normal. No adenopathy. No bone lesions. IMPRESSION: Patchy airspace opacity bilaterally, likely indicative of multifocal atypical organism pneumonia. No consolidation. Heart upper normal in size. No adenopathy evident. Electronically Signed   By: Bretta Bang III M.D.   On: 05/20/2020 14:14    Procedures Procedures (including critical care time)  Medications Ordered in ED Medications  ibuprofen (ADVIL) tablet 800 mg (800 mg Oral Given 05/20/20 1419)    ED Course  I have reviewed the triage vital signs and the nursing notes.  Pertinent labs & imaging results that were available during my care of the patient were reviewed by me and considered in my medical decision making (see chart for details).  Clinical Course as of May 20 1625  Wed May 20, 2020  1765 59 year old male with positive Covid test on November 1 presents to the ER with  worsening shortness of breath.  Patient was found to have O2 sat of 91% on room air, placed on nasal cannula by EMS with improvement.  Patient is mildly tachypneic on arrival and maintains O2 sats from 93 to 97% on nasal cannula.  Patient is febrile with temperature of 102, was given ibuprofen. Chest x-ray returns with findings consistent with COVID-19 infection.  Review of labs, CBC with mild to continue with white count of 3.9, mild decrease in platelets to 139, CMP with mildly elevated AST and ALT, findings consistent with Covid.  Covid swab is positive.  Plan is to admit to hospitalist for further monitoring and treatment with new oxygen requirement.   [LM]  1625 Case discussed with Dr. Randol Kern with [facility] who will consult for admission.    [LM]    Clinical Course User Index [LM] Alden Hipp   MDM Rules/Calculators/A&P                          Final Clinical Impression(s) / ED Diagnoses Final diagnoses:  COVID    Rx / DC Orders ED Discharge Orders    None       Jeannie Fend, PA-C 05/20/20 1626    Gerhard Munch, MD 05/22/20 [phone]

## 2020-05-20 NOTE — H&P (Signed)
[facility] H&P   Patient Demographics:    Brent Mercer, is a 59 y.o. male  MRN: [id number]   DOB - 01-15-1961  Admit Date - 05/20/2020  Outpatient Primary MD for the patient is Elfredia Nevins, MD  Referring MD/NP/PA: PA Army Melia  Patient coming from: Home  Chief Complaint  Patient presents with  . Shortness of Breath    Diagnosed with Covid on 11/2. Complaining of increased SOB, productive cough, fever and generalized weakness and discomfort.       HPI:    Brent Mercer  is a 59 y.o. male, with past medical history of hypertension, he was brought by EMS due to shortness of breath, patient reported tested positive for COVID-19 on 05/12/2020, as he was complaining of body ache, and cough, he does report worsening shortness of breath over last 3 to 4 days, fever, chills, poor appetite, fatigue, patient was noted 91% by EMS on room air, here in ED he was 87% on room air at rest upon my evaluation, vaccinated against Covid, he does report fever, chills, shortness of breath, cough, any hemoptysis, lower extremity edema or pain. - in ED and was started on 4 L nasal cannula, I tried him on room air where he did drop to 87%, he is currently back on 4 L oxygen, chest x-ray significant for multifocal opacity, his COVID-19 PCR test is positive, he has elevated AST of 57, ALT of 46, is febrile at 102, his ferritin is at 1400, CRP at 4.5, triage hospitalist consulted to admit    Review of systems:    In addition to the HPI above,  Reports fever and chills, poor appetite and fatigue No Headache, No changes with Vision or hearing, No problems swallowing food or Liquids, No Chest pain,he  does report cough and shortness of breath No Abdominal pain, No Nausea or Vommitting, Bowel movements are regular, No Blood in stool or Urine, No dysuria, No new skin rashes or bruises, No new joints  pains-aches,  No new weakness, tingling, numbness in any extremity, No recent weight gain or loss, No polyuria, polydypsia or polyphagia, No significant Mental Stressors.  A full 10 point Review of Systems was done, except as stated above, all other Review of Systems were negative.   With Past History of the following :    Past Medical History:  Diagnosis Date  . Headache   . Hypertension       Past Surgical History:  Procedure Laterality Date  . COLONOSCOPY N/A 08/11/2015   Procedure: COLONOSCOPY;  Surgeon: Franky Macho, MD;  Location: [facility];  Service: Gastroenterology;  Laterality: N/A;  . CYSTOSCOPY WITH INSERTION OF UROLIFT    . FOOT SURGERY Left   . HERNIA REPAIR    . NO PAST SURGERIES    . PLANTAR FASCIA RELEASE Left 02/12/2020   Procedure: OPEN PLANTAR FASCIOTOMY LEFT FOOT;  Surgeon:  Ferman Hamming, DPM;  Location: [facility];  Service: Podiatry;  Laterality: Left;      Social History:     Social History   Tobacco Use  . Smoking status: Never Smoker  . Smokeless tobacco: Never Used  Substance Use Topics  . Alcohol use: No        Family History :     Family History  Problem Relation Age of Onset  . Colon cancer Other       Home Medications:   Prior to Admission medications   Medication Sig Start Date End Date Taking? Authorizing Provider  BYSTOLIC 10 MG tablet Take 10 mg by mouth daily.  07/08/15  Yes [provider]     Allergies:     Allergies  Allergen Reactions  . Latex Rash     Physical Exam:   Vitals  Blood pressure 118/83, pulse 73, temperature (!) 102 F (38.9 C), temperature source Oral, resp. rate 19, height 5\' 11"  (1.803 m), weight 87.1 kg, SpO2 93 %.   1. General well developed male lying in bed in NAD,   2. Normal affect and insight, Not Suicidal or Homicidal, Awake Alert, Oriented X 3.  3. No F.N deficits, ALL C.Nerves Intact, Strength 5/5 all 4 extremities, Sensation intact all 4 extremities, Plantars  down going.  4. Ears and Eyes appear Normal, Conjunctivae clear, PERRLA. Moist Oral Mucosa.  5. Supple Neck, No JVD, No cervical lymphadenopathy appriciated, No Carotid Bruits.  6. Symmetrical Chest wall movement, Good air movement bilaterally, CTAB.  7. RRR, No Gallops, Rubs or Murmurs, No Parasternal Heave.  8. Positive Bowel Sounds, Abdomen Soft, No tenderness, No organomegaly appriciated,No rebound -guarding or rigidity.  9.  No Cyanosis, Normal Skin Turgor, No Skin Rash or Bruise.  10. Good muscle tone,  joints appear normal , no effusions, Normal ROM.  11. No Palpable Lymph Nodes in Neck or Axillae   Data Review:    CBC Recent Labs  Lab 05/20/20 1408  WBC 3.9*  HGB 17.0  HCT 52.0  PLT 139*  MCV 93.7  MCH 30.6  MCHC 32.7  RDW 12.6  LYMPHSABS 0.9  MONOABS 0.3  EOSABS 0.0  BASOSABS 0.0   ------------------------------------------------------------------------------------------------------------------  Chemistries  Recent Labs  Lab 05/20/20 1408  NA 135  K 3.7  CL 96*  CO2 27  GLUCOSE 125*  BUN 16  CREATININE 0.94  CALCIUM 8.4*  AST 57*  ALT 46*  ALKPHOS 58  BILITOT 0.8   ------------------------------------------------------------------------------------------------------------------ estimated creatinine clearance is 93.5 mL/min (by C-G formula based on SCr of 0.94 mg/dL). ------------------------------------------------------------------------------------------------------------------ No results for input(s): TSH, T4TOTAL, T3FREE, THYROIDAB in the last 72 hours.  Invalid input(s): FREET3  Coagulation profile No results for input(s): INR, PROTIME in the last 168 hours. ------------------------------------------------------------------------------------------------------------------- Recent Labs    05/20/20 1408  DDIMER 0.81*    -------------------------------------------------------------------------------------------------------------------  Cardiac Enzymes No results for input(s): CKMB, TROPONINI, MYOGLOBIN in the last 168 hours.  Invalid input(s): CK ------------------------------------------------------------------------------------------------------------------ No results found for: BNP   ---------------------------------------------------------------------------------------------------------------  Urinalysis No results found for: COLORURINE, APPEARANCEUR, LABSPEC, PHURINE, GLUCOSEU, HGBUR, BILIRUBINUR, KETONESUR, PROTEINUR, UROBILINOGEN, NITRITE, LEUKOCYTESUR  ----------------------------------------------------------------------------------------------------------------   Imaging Results:    DG Chest Port 1 View  Result Date: 05/20/2020 CLINICAL DATA:  Shortness of breath.  COVID-19 positive EXAM: PORTABLE CHEST 1 VIEW COMPARISON:  None. FINDINGS: Patchy airspace opacity is noted in each mid and lower lung region. No consolidation. Lungs elsewhere clear. Heart is upper normal in size with pulmonary vascularity normal. No  adenopathy. No bone lesions. IMPRESSION: Patchy airspace opacity bilaterally, likely indicative of multifocal atypical organism pneumonia. No consolidation. Heart upper normal in size. No adenopathy evident. Electronically Signed   By: Bretta Bang III M.D.   On: 05/20/2020 14:14    My personal review of EKG: Rhythm NSR, Rate  81 /min, QTc 422    Assessment & Plan:    Active Problems:   COVID-19 virus infection   Pneumonia due to COVID-19 virus  Acute hypoxic respiratory failure due to COVID-19 of pneumonia in the setting of COVID-19 pandemic. -Urgently he is unvaccinated. -Patient is hypoxic 87% on room air, he is tolerating 3 to 4 L nasal cannula currently, with chest x-ray significant for multifocal opacities, elevated inflammatory markers with CRP of 4.5, ferritin of  1400 -We will start on IV Solu-Medrol -We will start on IV remdesivir -Procalcitonin within normal limit, no indication for antibiotics - Encouraged the patient to sit up in chair in the daytime use I-S and flutter valve for pulmonary toiletry and then prone in bed when at night.  Will advance activity and titrate down oxygen as possible. -No far I see no indication for Actemra or baricitinib, given he is only requiring 3 L oxygen, but if this has worsened he did consented to use, did discuss Actemra/Baricitinib  off label use patient was told that if COVID-19 pneumonitis gets worse we might potentially use Actemra off label, patient denies any known history of active diverticulitis, tuberculosis or hepatitis, understands the risks and benefits and wants to proceed with Actemra treatment if required.  Transaminitis -in the setting of COVID-19 of pneumonia, continue to monitor closely  Hypertension -Continue with home Bystolic.    DVT Prophylaxis Lovenox   AM Labs Ordered, also please review Full Orders  Family Communication: Admission, patients condition and plan of care including tests being ordered have been discussed with the patient  who indicate understanding and agree with the plan and Code Status.  Code Status Full  Likely DC to  home  Condition GUARDED    Consults called: none    Admission status: inpatient    Time spent in minutes : 60 minutes   Huey Bienenstock M.D on 05/20/2020 at 4:43 PM   [facility] - Office  [phone]

## 2020-05-20 NOTE — ED Notes (Signed)
Date and time results received: 05/20/20 1614  (use smartphrase ".now" to insert current time)  Test: Covid Critical Value: Positive  Name of Provider Notified: Army Melia, PA  Orders Received? Or Actions Taken?: N/A

## 2020-05-20 NOTE — ED Notes (Signed)
REPORT CALLED TO STEPHANIE AT THIS TIME.

## 2020-05-20 NOTE — ED Notes (Signed)
Receiving nurse unable to take report at this time. Will call back.

## 2020-05-21 DIAGNOSIS — U071 COVID-19: Principal | ICD-10-CM

## 2020-05-21 DIAGNOSIS — R7989 Other specified abnormal findings of blood chemistry: Secondary | ICD-10-CM

## 2020-05-21 DIAGNOSIS — J1282 Pneumonia due to coronavirus disease 2019: Secondary | ICD-10-CM

## 2020-05-21 LAB — CBC WITH DIFFERENTIAL/PLATELET
Basophils Absolute: 0 10*3/uL (ref 0.0–0.1)
Basophils Relative: 0 %
Eosinophils Absolute: 0 10*3/uL (ref 0.0–0.5)
Eosinophils Relative: 0 %
HCT: 49.9 % (ref 39.0–52.0)
Hemoglobin: 16.1 g/dL (ref 13.0–17.0)
Lymphocytes Relative: 38 %
Lymphs Abs: 0.9 10*3/uL (ref 0.7–4.0)
MCH: 30.8 pg (ref 26.0–34.0)
MCHC: 32.3 g/dL (ref 30.0–36.0)
MCV: 95.4 fL (ref 80.0–100.0)
Monocytes Absolute: 0.1 10*3/uL (ref 0.1–1.0)
Monocytes Relative: 5 %
Neutro Abs: 1.4 10*3/uL — ABNORMAL LOW (ref 1.7–7.7)
Neutrophils Relative %: 57 %
Platelets: 149 10*3/uL — ABNORMAL LOW (ref 150–400)
RBC: 5.23 MIL/uL (ref 4.22–5.81)
RDW: 12.9 % (ref 11.5–15.5)
WBC: 2.4 10*3/uL — ABNORMAL LOW (ref 4.0–10.5)
nRBC: 0 % (ref 0.0–0.2)

## 2020-05-21 LAB — COMPREHENSIVE METABOLIC PANEL
ALT: 53 U/L — ABNORMAL HIGH (ref 0–44)
AST: 59 U/L — ABNORMAL HIGH (ref 15–41)
Albumin: 3.4 g/dL — ABNORMAL LOW (ref 3.5–5.0)
Alkaline Phosphatase: 57 U/L (ref 38–126)
Anion gap: 12 (ref 5–15)
BUN: 22 mg/dL — ABNORMAL HIGH (ref 6–20)
CO2: 26 mmol/L (ref 22–32)
Calcium: 8.4 mg/dL — ABNORMAL LOW (ref 8.9–10.3)
Chloride: 99 mmol/L (ref 98–111)
Creatinine, Ser: 0.96 mg/dL (ref 0.61–1.24)
GFR, Estimated: >60 mL/min (ref >60)
Glucose, Bld: 202 mg/dL — ABNORMAL HIGH (ref 70–99)
Potassium: 4.3 mmol/L (ref 3.5–5.1)
Sodium: 137 mmol/L (ref 135–145)
Total Bilirubin: 0.7 mg/dL (ref 0.3–1.2)
Total Protein: 7.1 g/dL (ref 6.5–8.1)

## 2020-05-21 LAB — HEMOGLOBIN A1C
Hgb A1c MFr Bld: 6.3 % — ABNORMAL HIGH (ref 4.8–5.6)
Mean Plasma Glucose: 134.11 mg/dL

## 2020-05-21 LAB — C-REACTIVE PROTEIN: CRP: 5.6 mg/dL — ABNORMAL HIGH (ref <1.0)

## 2020-05-21 LAB — GLUCOSE, CAPILLARY
Glucose-Capillary: 189 mg/dL — ABNORMAL HIGH (ref 70–99)
Glucose-Capillary: 243 mg/dL — ABNORMAL HIGH (ref 70–99)
Glucose-Capillary: 213 mg/dL — ABNORMAL HIGH (ref 70–99)
Glucose-Capillary: 222 mg/dL — ABNORMAL HIGH (ref 70–99)

## 2020-05-21 LAB — D-DIMER, QUANTITATIVE: D-Dimer, Quant: 0.71 ug/mL-FEU — ABNORMAL HIGH (ref 0.00–0.50)

## 2020-05-21 LAB — HIV ANTIBODY (ROUTINE TESTING W REFLEX): HIV Screen 4th Generation wRfx: NONREACTIVE

## 2020-05-21 MED ORDER — INSULIN ASPART 100 UNIT/ML ~~LOC~~ SOLN
0.0000 [IU] | Freq: Every day | SUBCUTANEOUS | Status: DC
  Administered 2020-05-21: 2 [IU] via SUBCUTANEOUS

## 2020-05-21 MED ORDER — INSULIN ASPART 100 UNIT/ML ~~LOC~~ SOLN
4.0000 [IU] | Freq: Three times a day (TID) | SUBCUTANEOUS | Status: DC
  Administered 2020-05-21 – 2020-05-22 (×5): 4 [IU] via SUBCUTANEOUS

## 2020-05-21 MED ORDER — INSULIN ASPART 100 UNIT/ML ~~LOC~~ SOLN
0.0000 [IU] | Freq: Three times a day (TID) | SUBCUTANEOUS | Status: DC
  Administered 2020-05-21 (×2): 7 [IU] via SUBCUTANEOUS
  Administered 2020-05-22: 11 [IU] via SUBCUTANEOUS
  Administered 2020-05-22: 7 [IU] via SUBCUTANEOUS

## 2020-05-21 NOTE — Plan of Care (Signed)

## 2020-05-21 NOTE — Progress Notes (Signed)
PROGRESS NOTE   Brent Mercer  ZOX:[id number] DOB: 1960-08-27 DOA: 05/20/2020 PCP: Elfredia Nevins, MD   Chief Complaint  Patient presents with  . Shortness of Breath    Diagnosed with Covid on 11/2. Complaining of increased SOB, productive cough, fever and generalized weakness and discomfort.     Brief Admission History:  59 y.o. male, with past medical history of hypertension, he was brought by EMS due to shortness of breath, patient reported tested positive for COVID-19 on 05/12/2020, as he was complaining of body ache, and cough, he does report worsening shortness of breath over last 3 to 4 days, fever, chills, poor appetite, fatigue, patient was noted 91% by EMS on room air, here in ED he was 87% on room air at rest upon my evaluation, vaccinated against Covid, he does report fever, chills, shortness of breath, cough, any hemoptysis, lower extremity edema or pain. - in ED and was started on 4 L nasal cannula, I tried him on room air where he did drop to 87%, he is currently back on 4 L oxygen, chest x-ray significant for multifocal opacity, his COVID-19 PCR test is positive, he has elevated AST of 57, ALT of 46, is febrile at 102, his ferritin is at 1400, CRP at 4.5, triage hospitalist consulted to admit.    Assessment & Plan:   Active Problems:   COVID-19 virus infection   Pneumonia due to COVID-19 virus   1. Covid Pneumonia - Continue supportive measures and IV remdesivir and steroids.  Pt remains on 3L/min [place].  Continue to encourage IS and prone positioning in bed. Follow inflammatory markers.  If he remains stable, could possibly discharge home tomorrow with supplemental oxygen and outpatient remdesivir infusions.  2. Elevated LFTs - reactive to Covid infection, following.  3. Essential hypertension - resumed home bystolic.  BPs have been controlled   DVT prophylaxis:  Enoxaparin  Code Status:  Full  Family Communication: discussed with patient at bedside  Disposition: home  tomorrow if remains stable to improved  Status is: Inpatient  Remains inpatient appropriate because:Inpatient level of care appropriate due to severity of illness   Dispo:  Patient From: Home  Planned Disposition: Home  Expected discharge date: 05/22/20  Medically stable for discharge: No Consultants:     Procedures:     Antimicrobials:  Remdesivir 11/10>>  Subjective: Pt says he is breathing better on supplemental oxygen.   Objective: Vitals:   05/20/20 2033 05/20/20 2041 05/21/20 0024 05/21/20 0505  BP: (!) 131/91  116/83 (!) 140/94  Pulse: 72  65 69  Resp: 20  20 20   Temp: 98.8 F (37.1 C)  97.8 F (36.6 C) 97.9 F (36.6 C)  TempSrc:      SpO2: 96% 95% 95% 92%  Weight:      Height:        Intake/Output Summary (Last 24 hours) at 05/21/2020 1253 Last data filed at 05/21/2020 0937 Gross per 24 hour  Intake 809 ml  Output --  Net 809 ml   Filed Weights   05/20/20 1333  Weight: 87.1 kg    Examination:  General exam: Appears calm and comfortable  Respiratory system: RLL rales heard. Respiratory effort normal. Cardiovascular system: S1 & S2 heard, RRR. No JVD, murmurs, rubs, gallops or clicks. No pedal edema. Gastrointestinal system: Abdomen is nondistended, soft and nontender. No organomegaly or masses felt. Normal bowel sounds heard. Central nervous system: Alert and oriented. No focal neurological deficits. Extremities: Symmetric 5 x 5 power. Skin: No  rashes, lesions or ulcers Psychiatry: Mood & affect appropriate.   Data Reviewed: I have personally reviewed following labs and imaging studies  CBC: Recent Labs  Lab 05/20/20 1408 05/21/20 0524  WBC 3.9* 2.4*  NEUTROABS 2.7 1.4*  HGB 17.0 16.1  HCT 52.0 49.9  MCV 93.7 95.4  PLT 139* 149*    Basic Metabolic Panel: Recent Labs  Lab 05/20/20 1408 05/21/20 0524  NA 135 137  K 3.7 4.3  CL 96* 99  CO2 27 26  GLUCOSE 125* 202*  BUN 16 22*  CREATININE 0.94 0.96  CALCIUM 8.4* 8.4*     GFR: Estimated Creatinine Clearance: 91.5 mL/min (by C-G formula based on SCr of 0.96 mg/dL).  Liver Function Tests: Recent Labs  Lab 05/20/20 1408 05/21/20 0524  AST 57* 59*  ALT 46* 53*  ALKPHOS 58 57  BILITOT 0.8 0.7  PROT 7.3 7.1  ALBUMIN 3.7 3.4*    CBG: Recent Labs  Lab 05/21/20 0748 05/21/20 1128  GLUCAP 189* 243*    Recent Results (from the past 240 hour(s))  Blood Culture (routine x 2)     Status: None (Preliminary result)   Collection Time: 05/20/20  2:08 PM   Specimen: BLOOD  Result Value Ref Range Status   Specimen Description BLOOD RIGHT ANTECUBITAL  Final   Special Requests   Final    BOTTLES DRAWN AEROBIC AND ANAEROBIC Blood Culture results may not be optimal due to an inadequate volume of blood received in culture bottles   Culture   Final    NO GROWTH < 24 HOURS Performed at [facility], [address] [place], [place] [address]    Report Status PENDING  Incomplete  Respiratory Panel by RT PCR (Flu A&B, Covid) - Nasopharyngeal Swab     Status: Abnormal   Collection Time: 05/20/20  2:08 PM   Specimen: Nasopharyngeal Swab  Result Value Ref Range Status   SARS Coronavirus 2 by RT PCR POSITIVE (A) NEGATIVE Final    Comment: RESULT CALLED TO, READ BACK BY AND VERIFIED WITH: CUGINOM,M ON 05/20/20 AT 1610 BY LOY,C (NOTE) SARS-CoV-2 target nucleic acids are DETECTED.  SARS-CoV-2 RNA is generally detectable in upper respiratory specimens  during the acute phase of infection. Positive results are indicative of the presence of the identified virus, but do not rule out bacterial infection or co-infection with other pathogens not detected by the test. Clinical correlation with patient history and other diagnostic information is necessary to determine patient infection status. The expected result is Negative.  Fact Sheet for Patients:  https://www.moore.com/  Fact Sheet for Healthcare  Providers: https://www.young.biz/  This test is not yet approved or cleared by the [place] FDA and  has been authorized for detection and/or diagnosis of SARS-CoV-2 by FDA under an Emergency Use Authorization (EUA).  This EUA will remain in effect (meaning this test can  be used) for the duration of  the COVID-19 declaration under Section 564(b)(1) of the Act, 21 U.S.C. section 360bbb-3(b)(1), unless the authorization is terminated or revoked sooner.      Influenza A by PCR NEGATIVE NEGATIVE Final   Influenza B by PCR NEGATIVE NEGATIVE Final    Comment: (NOTE) The Xpert Xpress SARS-CoV-2/FLU/RSV assay is intended as an aid in  the diagnosis of influenza from Nasopharyngeal swab specimens and  should not be used as a sole basis for treatment. Nasal washings and  aspirates are unacceptable for Xpert Xpress SARS-CoV-2/FLU/RSV  testing.  Fact Sheet for Patients: https://www.moore.com/  Fact Sheet  for Healthcare Providers: https://www.young.biz/  This test is not yet approved or cleared by the [place] and  has been authorized for detection and/or diagnosis of SARS-CoV-2 by  FDA under an Emergency Use Authorization (EUA). This EUA will remain  in effect (meaning this test can be used) for the duration of the  Covid-19 declaration under Section 564(b)(1) of the Act, 21  U.S.C. section 360bbb-3(b)(1), unless the authorization is  terminated or revoked. Performed at [facility], [address], [place], [place] [address]   Blood Culture (routine x 2)     Status: None (Preliminary result)   Collection Time: 05/20/20  2:24 PM   Specimen: BLOOD RIGHT HAND  Result Value Ref Range Status   Specimen Description BLOOD RIGHT HAND  Final   Special Requests   Final    BOTTLES DRAWN AEROBIC AND ANAEROBIC Blood Culture results may not be optimal due to an inadequate volume of blood received in culture bottles   Culture    Final    NO GROWTH < 24 HOURS Performed at [facility], [address], [place], [place] [address]    Report Status PENDING  Incomplete     Radiology Studies: DG Chest Port 1 View  Result Date: 05/20/2020 CLINICAL DATA:  Shortness of breath.  COVID-19 positive EXAM: PORTABLE CHEST 1 VIEW COMPARISON:  None. FINDINGS: Patchy airspace opacity is noted in each mid and lower lung region. No consolidation. Lungs elsewhere clear. Heart is upper normal in size with pulmonary vascularity normal. No adenopathy. No bone lesions. IMPRESSION: Patchy airspace opacity bilaterally, likely indicative of multifocal atypical organism pneumonia. No consolidation. Heart upper normal in size. No adenopathy evident. Electronically Signed   By: Bretta Bang III M.D.   On: 05/20/2020 14:14     Scheduled Meds: . enoxaparin (LOVENOX) injection  40 mg Subcutaneous Q24H  . insulin aspart  0-20 Units Subcutaneous TID WC  . insulin aspart  0-5 Units Subcutaneous QHS  . insulin aspart  4 Units Subcutaneous TID WC  . methylPREDNISolone (SOLU-MEDROL) injection  1 mg/kg Intravenous Q12H   Followed by  . [START ON 05/24/2020] predniSONE  50 mg Oral Daily  . nebivolol  10 mg Oral Daily  . pantoprazole  40 mg Oral Daily   Continuous Infusions: . remdesivir 100 mg in NS 100 mL 100 mg (05/21/20 0934)     LOS: 1 day   Time spent: 35 mins    Pansie Guggisberg Laural Benes, MD How to contact the [facility] Attending or Consulting provider 7A - 7P or covering provider during after hours 7P -7A, for this patient?  1. Check the care team in [facility] and look for a) attending/consulting [facility] provider listed and b) the [facility] team listed 2. Log into [email or web address] and use [place]'s universal password to access. If you do not have the password, please contact the hospital operator. 3. Locate the [facility] provider you are looking for under [facility] and page to a number that you can be directly reached. 4. If you still have difficulty reaching  the provider, please page the [facility] (Director on Call) for the Hospitalists listed on amion for assistance.  05/21/2020, 12:53 PM

## 2020-05-22 DIAGNOSIS — R7303 Prediabetes: Secondary | ICD-10-CM | POA: Diagnosis present

## 2020-05-22 LAB — CBC WITH DIFFERENTIAL/PLATELET
Band Neutrophils: 6 %
Basophils Absolute: 0 10*3/uL (ref 0.0–0.1)
Basophils Relative: 0 %
Eosinophils Absolute: 0 10*3/uL (ref 0.0–0.5)
Eosinophils Relative: 0 %
HCT: 46.1 % (ref 39.0–52.0)
Hemoglobin: 15.1 g/dL (ref 13.0–17.0)
Lymphocytes Relative: 31 %
Lymphs Abs: 1.8 10*3/uL (ref 0.7–4.0)
MCH: 30.7 pg (ref 26.0–34.0)
MCHC: 32.8 g/dL (ref 30.0–36.0)
MCV: 93.7 fL (ref 80.0–100.0)
Monocytes Absolute: 0.5 10*3/uL (ref 0.1–1.0)
Monocytes Relative: 9 %
Neutro Abs: 3.5 10*3/uL (ref 1.7–7.7)
Neutrophils Relative %: 54 %
Platelets: 181 10*3/uL (ref 150–400)
RBC: 4.92 MIL/uL (ref 4.22–5.81)
RDW: 12.7 % (ref 11.5–15.5)
WBC: 5.9 10*3/uL (ref 4.0–10.5)
nRBC: 0 % (ref 0.0–0.2)

## 2020-05-22 LAB — COMPREHENSIVE METABOLIC PANEL
ALT: 57 U/L — ABNORMAL HIGH (ref 0–44)
AST: 51 U/L — ABNORMAL HIGH (ref 15–41)
Albumin: 3.2 g/dL — ABNORMAL LOW (ref 3.5–5.0)
Alkaline Phosphatase: 52 U/L (ref 38–126)
Anion gap: 11 (ref 5–15)
BUN: 26 mg/dL — ABNORMAL HIGH (ref 6–20)
CO2: 28 mmol/L (ref 22–32)
Calcium: 8.3 mg/dL — ABNORMAL LOW (ref 8.9–10.3)
Chloride: 99 mmol/L (ref 98–111)
Creatinine, Ser: 0.94 mg/dL (ref 0.61–1.24)
GFR, Estimated: >60 mL/min (ref >60)
Glucose, Bld: 212 mg/dL — ABNORMAL HIGH (ref 70–99)
Potassium: 4.1 mmol/L (ref 3.5–5.1)
Sodium: 138 mmol/L (ref 135–145)
Total Bilirubin: 0.7 mg/dL (ref 0.3–1.2)
Total Protein: 6.4 g/dL — ABNORMAL LOW (ref 6.5–8.1)

## 2020-05-22 LAB — GLUCOSE, CAPILLARY
Glucose-Capillary: 222 mg/dL — ABNORMAL HIGH (ref 70–99)
Glucose-Capillary: 277 mg/dL — ABNORMAL HIGH (ref 70–99)

## 2020-05-22 LAB — C-REACTIVE PROTEIN: CRP: 1.4 mg/dL — ABNORMAL HIGH (ref <1.0)

## 2020-05-22 LAB — D-DIMER, QUANTITATIVE: D-Dimer, Quant: 0.3 ug/mL-FEU (ref 0.00–0.50)

## 2020-05-22 MED ORDER — METFORMIN HCL ER 500 MG PO TB24: 500.0000 mg | ORAL_TABLET | Freq: Every day | ORAL | 0 refills | Status: DC

## 2020-05-22 MED ORDER — ZINC 220 (50 ZN) MG PO CAPS: 1.0000 | ORAL_CAPSULE | Freq: Every day | ORAL | 0 refills | Status: DC

## 2020-05-22 MED ORDER — PREDNISONE 20 MG PO TABS: 40.0000 mg | ORAL_TABLET | Freq: Every day | ORAL | 0 refills | Status: AC

## 2020-05-22 MED ORDER — ASCORBIC ACID 500 MG PO CAPS: 1.0000 | ORAL_CAPSULE | Freq: Every day | ORAL | 0 refills | Status: DC

## 2020-05-22 MED ORDER — OMEPRAZOLE 20 MG PO CPDR: 20.0000 mg | DELAYED_RELEASE_CAPSULE | Freq: Every day | ORAL | 0 refills | Status: DC

## 2020-05-22 MED ORDER — GUAIFENESIN-DM 100-10 MG/5ML PO SYRP: 10.0000 mL | ORAL_SOLUTION | ORAL | 0 refills | Status: DC | PRN

## 2020-05-22 NOTE — Discharge Instructions (Signed)
Patient scheduled for outpatient Remdesivir infusion at 11am on Saturday 11/13 at [facility]. Please inform the patient to park at [address], [place], as staff will be escorting the patient through the east entrance of the hospital. Appointments take approximately 45 minutes.    There is a wave flag banner located near the entrance on [address] Turn into this entrance and immediately turn left or right and park in 1 of the 10 designated Covid Infusion Parking spots. There is a phone number on the sign, please call and let the staff know what spot you are in and we will come out and get you. For questions call [phone].  Thanks.     10 Things You Can Do to Manage Your COVID-19 Symptoms at Home If you have possible or confirmed COVID-19: 1. Stay home from work and school. And stay away from other public places. If you must go out, avoid using any kind of public transportation, ridesharing, or taxis. 2. Monitor your symptoms carefully. If your symptoms get worse, call your healthcare provider immediately. 3. Get rest and stay hydrated. 4. If you have a medical appointment, call the healthcare provider ahead of time and tell them that you have or may have COVID-19. 5. For medical emergencies, call 911 and notify the dispatch personnel that you have or may have COVID-19. 6. Cover your cough and sneezes with a tissue or use the inside of your elbow. 7. Wash your hands often with soap and water for at least 20 seconds or clean your hands with an alcohol-based hand sanitizer that contains at least 60% alcohol. 8. As much as possible, stay in a specific room and away from other people in your home. Also, you should use a separate bathroom, if available. If you need to be around other people in or outside of the home, wear a mask. 9. Avoid sharing personal items with other people in your household, like dishes, towels, and bedding. 10. Clean all surfaces that are touched often, like  counters, tabletops, and doorknobs. Use household cleaning sprays or wipes according to the label instructions. SouthAmericaFlowers.co.ukcdc.gov/coronavirus 01/09/2019 This information is not intended to replace advice given to you by your health care provider. Make sure you discuss any questions you have with your health care provider. Document Revised: 06/13/2019 Document Reviewed: 06/13/2019 Elsevier Patient Education  2020 Elsevier Inc.  COVID-19: How to Protect Yourself and Others Know how it spreads  There is currently no vaccine to prevent coronavirus disease 2019 (COVID-19).  The best way to prevent illness is to avoid being exposed to this virus.  The virus is thought to spread mainly from person-to-person. ? Between people who are in close contact with one another (within about 6 feet). ? Through respiratory droplets produced when an infected person coughs, sneezes or talks. ? These droplets can land in the mouths or noses of people who are nearby or possibly be inhaled into the lungs. ? COVID-19 may be spread by people who are not showing symptoms. Everyone should Clean your hands often  Wash your hands often with soap and water for at least 20 seconds especially after you have been in a public place, or after blowing your nose, coughing, or sneezing.  If soap and water are not readily available, use a hand sanitizer that contains at least 60% alcohol. Cover all surfaces of your hands and rub them together until they feel dry.  Avoid touching your eyes, nose, and mouth with unwashed hands.  Avoid close contact  Limit contact with others as much as possible.  Avoid close contact with people who are sick.  Put distance between yourself and other people. ? Remember that some people without symptoms may be able to spread virus. ? This is especially important for people who are at higher risk of getting very  RetroStamps.it Cover your mouth and nose with a mask when around others  You could spread COVID-19 to others even if you do not feel sick.  Everyone should wear a mask in public settings and when around people not living in their household, especially when social distancing is difficult to maintain. ? Masks should not be placed on young children under age 59, anyone who has trouble breathing, or is unconscious, incapacitated or otherwise unable to remove the mask without assistance.  The mask is meant to protect other people in case you are infected.  Do NOT use a facemask meant for a Research scientist (physical sciences).  Continue to keep about 6 feet between yourself and others. The mask is not a substitute for social distancing. Cover coughs and sneezes  Always cover your mouth and nose with a tissue when you cough or sneeze or use the inside of your elbow.  Throw used tissues in the trash.  Immediately wash your hands with soap and water for at least 20 seconds. If soap and water are not readily available, clean your hands with a hand sanitizer that contains at least 60% alcohol. Clean and disinfect  Clean AND disinfect frequently touched surfaces daily. This includes tables, doorknobs, light switches, countertops, handles, desks, phones, keyboards, toilets, faucets, and sinks. ktimeonline.com  If surfaces are dirty, clean them: Use detergent or soap and water prior to disinfection.  Then, use a household disinfectant. You can see a list of EPA-registered household disinfectants here. SouthAmericaFlowers.co.uk 03/13/2019 This information is not intended to replace advice given to you by your health care provider. Make sure you discuss any questions you have with your health care provider. Document Revised: 03/21/2019 Document Reviewed: 01/17/2019 Elsevier Patient  Education  2020 [employer].   COVID-19 Frequently Asked Questions COVID-19 (coronavirus disease) is an infection that is caused by a large family of viruses. Some viruses cause illness in people and others cause illness in animals like camels, cats, and bats. In some cases, the viruses that cause illness in animals can spread to humans. Where did the coronavirus come from? In December 2019, [place] told the [employer] [facility]) of several cases of lung disease (human respiratory illness). These cases were linked to an open seafood and livestock market in the city of [place]. The link to the seafood and livestock market suggests that the virus may have spread from animals to humans. However, since that first outbreak in December, the virus has also been shown to spread from person to person. What is the name of the disease and the virus? Disease name Early on, this disease was called novel coronavirus. This is because scientists determined that the disease was caused by a new (novel) respiratory virus. The World Health Organization [facility]) has now named the disease COVID-19, or coronavirus disease. Virus name The virus that causes the disease is called severe acute respiratory syndrome coronavirus 2 (SARS-CoV-2). More information on disease and virus naming World Health Organization [facility]): www.who.int/emergencies/diseases/novel-coronavirus-2019/technical-guidance/naming-the-coronavirus-disease-(covid-2019)-and-the-virus-that-causes-it Who is at risk for complications from coronavirus disease? Some people may be at higher risk for complications from coronavirus disease. This includes older adults and people who have chronic diseases, such as  heart disease, diabetes, and lung disease. If you are at higher risk for complications, take these extra precautions:  Stay home as much as possible.  Avoid social gatherings and travel.  Avoid close contact with others. Stay at least 6 ft (2 m) away  from others, if possible.  Wash your hands often with soap and water for at least 20 seconds.  Avoid touching your face, mouth, nose, or eyes.  Keep supplies on hand at home, such as food, medicine, and cleaning supplies.  If you must go out in public, wear a cloth face covering or face mask. Make sure your mask covers your nose and mouth. How does coronavirus disease spread? The virus that causes coronavirus disease spreads easily from person to person (is contagious). You may catch the virus by:  Breathing in droplets from an infected person. Droplets can be spread by a person breathing, speaking, singing, coughing, or sneezing.  Touching something, like a table or a doorknob, that was exposed to the virus (contaminated) and then touching your mouth, nose, or eyes. Can I get the virus from touching surfaces or objects? There is still a lot that we do not know about the virus that causes coronavirus disease. Scientists are basing a lot of information on what they know about similar viruses, such as:  Viruses cannot generally survive on surfaces for long. They need a human body (host) to survive.  It is more likely that the virus is spread by close contact with people who are sick (direct contact), such as through: ? Shaking hands or hugging. ? Breathing in respiratory droplets that travel through the air. Droplets can be spread by a person breathing, speaking, singing, coughing, or sneezing.  It is less likely that the virus is spread when a person touches a surface or object that has the virus on it (indirect contact). The virus may be able to enter the body if the person touches a surface or object and then touches his or her face, eyes, nose, or mouth. Can a person spread the virus without having symptoms of the disease? It may be possible for the virus to spread before a person has symptoms of the disease, but this is most likely not the main way the virus is spreading. It is more  likely for the virus to spread by being in close contact with people who are sick and breathing in the respiratory droplets spread by a person breathing, speaking, singing, coughing, or sneezing. What are the symptoms of coronavirus disease? Symptoms vary from person to person and can range from mild to severe. Symptoms may include:  Fever or chills.  Cough.  Difficulty breathing or feeling short of breath.  Headaches, body aches, or muscle aches.  Runny or stuffy (congested) nose.  Sore throat.  New loss of taste or smell.  Nausea, vomiting, or diarrhea. These symptoms can appear anywhere from 2 to 14 days after you have been exposed to the virus. Some people may not have any symptoms. If you develop symptoms, call your health care provider. People with severe symptoms may need hospital care. Should I be tested for this virus? Your health care provider will decide whether to test you based on your symptoms, history of exposure, and your risk factors. How does a health care provider test for this virus? Health care providers will collect samples to send for testing. Samples may include:  Taking a swab of fluid from the back of your nose and throat, your nose, or  your throat.  Taking fluid from the lungs by having you cough up mucus (sputum) into a sterile cup.  Taking a blood sample. Is there a treatment or vaccine for this virus? Currently, there is no vaccine to prevent coronavirus disease. Also, there are no medicines like antibiotics or antivirals to treat the virus. A person who becomes sick is given supportive care, which means rest and fluids. A person may also relieve his or her symptoms by using over-the-counter medicines that treat sneezing, coughing, and runny nose. These are the same medicines that a person takes for the common cold. If you develop symptoms, call your health care provider. People with severe symptoms may need hospital care. What can I do to protect myself  and my family from this virus?     You can protect yourself and your family by taking the same actions that you would take to prevent the spread of other viruses. Take the following actions:  Wash your hands often with soap and water for at least 20 seconds. If soap and water are not available, use alcohol-based hand sanitizer.  Avoid touching your face, mouth, nose, or eyes.  Cough or sneeze into a tissue, sleeve, or elbow. Do not cough or sneeze into your hand or the air. ? If you cough or sneeze into a tissue, throw it away immediately and wash your hands.  Disinfect objects and surfaces that you frequently touch every day.  Stay away from people who are sick.  Avoid going out in public, follow guidance from your state and local health authorities.  Avoid crowded indoor spaces. Stay at least 6 ft (2 m) away from others.  If you must go out in public, wear a cloth face covering or face mask. Make sure your mask covers your nose and mouth.  Stay home if you are sick, except to get medical care. Call your health care provider before you get medical care. Your health care provider will tell you how long to stay home.  Make sure your vaccines are up to date. Ask your health care provider what vaccines you need. What should I do if I need to travel? Follow travel recommendations from your local health authority, the CDC, and WHO. Travel information and advice  Centers for Disease Control and Prevention (CDC): GeminiCard.gl  World Health Organization [place]): PreviewDomains.se Know the risks and take action to protect your health  You are at higher risk of getting coronavirus disease if you are traveling to areas with an outbreak or if you are exposed to travelers from areas with an outbreak.  Wash your hands often and practice good hygiene to lower the risk of catching or spreading the  virus. What should I do if I am sick? General instructions to stop the spread of infection  Wash your hands often with soap and water for at least 20 seconds. If soap and water are not available, use alcohol-based hand sanitizer.  Cough or sneeze into a tissue, sleeve, or elbow. Do not cough or sneeze into your hand or the air.  If you cough or sneeze into a tissue, throw it away immediately and wash your hands.  Stay home unless you must get medical care. Call your health care provider or local health authority before you get medical care.  Avoid public areas. Do not take public transportation, if possible.  If you can, wear a mask if you must go out of the house or if you are in close contact with someone  who is not sick. Make sure your mask covers your nose and mouth. Keep your home clean  Disinfect objects and surfaces that are frequently touched every day. This may include: ? Counters and tables. ? Doorknobs and light switches. ? Sinks and faucets. ? Electronics such as phones, remote controls, keyboards, computers, and tablets.  Wash dishes in hot, soapy water or use a dishwasher. Air-dry your dishes.  Wash laundry in hot water. Prevent infecting other household members  Let healthy household members care for children and pets, if possible. If you have to care for children or pets, wash your hands often and wear a mask.  Sleep in a different bedroom or bed, if possible.  Do not share personal items, such as razors, toothbrushes, deodorant, combs, brushes, towels, and washcloths. Where to find more information Centers for Disease Control and Prevention (CDC)  Information and news updates: CardRetirement.cz World Health Organization [facility])  Information and news updates: AffordableSalon.es  Coronavirus health topic: https://thompson-craig.com/  Questions and answers on COVID-19:  kruiseway.com  Global tracker: who.sprinklr.com American Academy of Pediatrics (AAP)  Information for families: www.healthychildren.org/English/health-issues/conditions/chest-lungs/Pages/2019-Novel-Coronavirus.aspx The coronavirus situation is changing rapidly. Check your local health authority website or the CDC and [facility] websites for updates and news. When should I contact a health care provider?  Contact your health care provider if you have symptoms of an infection, such as fever or cough, and you: ? Have been near anyone who is known to have coronavirus disease. ? Have come into contact with a person who is suspected to have coronavirus disease. ? Have traveled to an area where there is an outbreak of COVID-19. When should I get emergency medical care?  Get help right away by calling your local emergency services (911 in the [place] if you have: ? Trouble breathing. ? Pain or pressure in your chest. ? Confusion. ? Blue-tinged lips and fingernails. ? Difficulty waking from sleep. ? Symptoms that get worse. Let the emergency medical personnel know if you think you have coronavirus disease. Summary  A new respiratory virus is spreading from person to person and causing COVID-19 (coronavirus disease).  The virus that causes COVID-19 appears to spread easily. It spreads from one person to another through droplets from breathing, speaking, singing, coughing, or sneezing.  Older adults and those with chronic diseases are at higher risk of disease. If you are at higher risk for complications, take extra precautions.  There is currently no vaccine to prevent coronavirus disease. There are no medicines, such as antibiotics or antivirals, to treat the virus.  You can protect yourself and your family by washing your hands often, avoiding touching your face, and covering your coughs and sneezes. This information is not intended to replace advice given to you  by your health care provider. Make sure you discuss any questions you have with your health care provider. Document Revised: 04/26/2019 Document Reviewed: 10/23/2018 Elsevier Patient Education  2020 Elsevier Inc.  COVID-19: Quarantine vs. Isolation QUARANTINE keeps someone who was in close contact with someone who has COVID-19 away from others. If you had close contact with a person who has COVID-19  Stay home until 14 days after your last contact.  Check your temperature twice a day and watch for symptoms of COVID-19.  If possible, stay away from people who are at higher-risk for getting very sick from COVID-19. ISOLATION keeps someone who is sick or tested positive for COVID-19 without symptoms away from others, even in their own home. If you are sick and  think or know you have COVID-19  Stay home until after ? At least 10 days since symptoms first appeared and ? At least 24 hours with no fever without fever-reducing medication and ? Symptoms have improved If you tested positive for COVID-19 but do not have symptoms  Stay home until after ? 10 days have passed since your positive test If you live with others, stay in a specific "sick room" or area and away from other people or animals, including pets. Use a separate bathroom, if available. SouthAmericaFlowers.co.uk 01/28/2019 This information is not intended to replace advice given to you by your health care provider. Make sure you discuss any questions you have with your health care provider. Document Revised: 06/13/2019 Document Reviewed: 06/13/2019 Elsevier Patient Education  2020 Elsevier Inc.    IMPORTANT INFORMATION: PAY CLOSE ATTENTION   PHYSICIAN DISCHARGE INSTRUCTIONS  Follow with Primary care provider  Elfredia Nevins, MD  and other consultants as instructed by your Hospitalist Physician  SEEK MEDICAL CARE OR RETURN TO EMERGENCY ROOM IF SYMPTOMS COME BACK, WORSEN OR NEW PROBLEM DEVELOPS   Please note: You were cared  for by a hospitalist during your hospital stay. Every effort will be made to forward records to your primary care provider.  You can request that your primary care provider send for your hospital records if they have not received them.  Once you are discharged, your primary care physician will handle any further medical issues. Please note that NO REFILLS for any discharge medications will be authorized once you are discharged, as it is imperative that you return to your primary care physician (or establish a relationship with a primary care physician if you do not have one) for your post hospital discharge needs so that they can reassess your need for medications and monitor your lab values.  Please get a complete blood count and chemistry panel checked by your Primary MD at your next visit, and again as instructed by your Primary MD.  Get Medicines reviewed and adjusted: Please take all your medications with you for your next visit with your Primary MD  Laboratory/radiological data: Please request your Primary MD to go over all hospital tests and procedure/radiological results at the follow up, please ask your primary care provider to get all Hospital records sent to his/her office.  In some cases, they will be blood work, cultures and biopsy results pending at the time of your discharge. Please request that your primary care provider follow up on these results.  If you are diabetic, please bring your blood sugar readings with you to your follow up appointment with primary care.    Please call and make your follow up appointments as soon as possible.    Also Note the following: If you experience worsening of your admission symptoms, develop shortness of breath, life threatening emergency, suicidal or homicidal thoughts you must seek medical attention immediately by calling 911 or calling your MD immediately  if symptoms less severe.  You must read complete instructions/literature along with all  the possible adverse reactions/side effects for all the Medicines you take and that have been prescribed to you. Take any new Medicines after you have completely understood and accpet all the possible adverse reactions/side effects.   Do not drive when taking Pain medications or sleeping medications (Benzodiazepines)  Do not take more than prescribed Pain, Sleep and Anxiety Medications. It is not advisable to combine anxiety,sleep and pain medications without talking with your primary care practitioner  Special Instructions: If  you have smoked or chewed Tobacco  in the last 2 yrs please stop smoking, stop any regular Alcohol  and or any Recreational drug use.  Wear Seat belts while driving.  Do not drive if taking any narcotic, mind altering or controlled substances or recreational drugs or alcohol.

## 2020-05-22 NOTE — Progress Notes (Signed)
Patient scheduled for outpatient Remdesivir infusion at 11am on Saturday 11/13 at [facility]. Please inform the patient to park at [address] [place], [place], as staff will be escorting the patient through the east entrance of the hospital. Appointments take approximately 45 minutes.    There is a wave flag banner located near the entrance on [address] Turn into this entrance and immediately turn left or right and park in 1 of the 10 designated Covid Infusion Parking spots. There is a phone number on the sign, please call and let the staff know what spot you are in and we will come out and get you. For questions call [phone].  Thanks.

## 2020-05-22 NOTE — TOC Initial Note (Signed)
Transition of Care [facility]) - Initial/Assessment Note    Patient Details  Name: Brent Mercer MRN: [id number] Date of Birth: March 16, 1961  Transition of [facility]) CM/SW Contact:    Annice Needy, LCSW Phone Number: 05/22/2020, 11:30 AM  Clinical Narrative:                 Patient from home. Covid+. Needs home oxygen. Oxygen providers discussed. Oxygen referral placed with [employer].   Expected Discharge Plan: Home/Self Care Barriers to Discharge: No Barriers Identified   Patient Goals and CMS Choice Patient states their goals for this hospitalization and ongoing recovery are:: return home      Expected Discharge Plan and Services Expected Discharge Plan: Home/Self Care         Expected Discharge Date: 05/22/20                                    Prior Living Arrangements/Services     Patient language and need for interpreter reviewed:: Yes        Need for Family Participation in Patient Care: Yes (Comment) Care giver support system in place?: Yes (comment)   Criminal Activity/Legal Involvement Pertinent to Current Situation/Hospitalization: No - Comment as needed  Activities of Daily Living Home Assistive Devices/Equipment: None ADL Screening (condition at time of admission) Patient's cognitive ability adequate to safely complete daily activities?: Yes Is the patient deaf or have difficulty hearing?: No Does the patient have difficulty seeing, even when wearing glasses/contacts?: No Does the patient have difficulty concentrating, remembering, or making decisions?: No Patient able to express need for assistance with ADLs?: Yes Does the patient have difficulty dressing or bathing?: No Independently performs ADLs?: Yes (appropriate for developmental age) Does the patient have difficulty walking or climbing stairs?: No Weakness of Legs: None Weakness of Arms/Hands: None  Permission Sought/Granted                  Emotional Assessment     Affect  (typically observed): Appropriate Orientation: : Oriented to Self, Oriented to Place, Oriented to  Time, Oriented to Situation   Psych Involvement: No (comment)  Admission diagnosis:  COVID [U07.1] Pneumonia due to COVID-19 virus [U07.1, J12.82] Patient Active Problem List   Diagnosis Date Noted  . Prediabetes 05/22/2020  . COVID-19 virus infection 05/20/2020  . Pneumonia due to COVID-19 virus 05/20/2020   PCP:  Elfredia Nevins, MD Pharmacy:   [facility] [address] [place] [address] [place] [address] [place] [address] [place] [address] Phone: [phone] Fax: [phone]     Social Determinants of Health (SDOH) Interventions    Readmission Risk Interventions No flowsheet data found.

## 2020-05-22 NOTE — Plan of Care (Signed)
  Problem: Education: Goal: Knowledge of General Education information will improve Description: Including pain rating scale, medication(s)/side effects and non-pharmacologic comfort measures 05/22/2020 1133 by Cameron Ali, RN Outcome: Completed/Met 05/22/2020 0829 by Cameron Ali, RN Outcome: Progressing   Problem: Health Behavior/Discharge Planning: Goal: Ability to manage health-related needs will improve 05/22/2020 1133 by Cameron Ali, RN Outcome: Completed/Met 05/22/2020 0829 by Cameron Ali, RN Outcome: Progressing   Problem: Clinical Measurements: Goal: Ability to maintain clinical measurements within normal limits will improve 05/22/2020 1133 by Cameron Ali, RN Outcome: Completed/Met 05/22/2020 0829 by Cameron Ali, RN Outcome: Progressing Goal: Will remain free from infection 05/22/2020 1133 by Cameron Ali, RN Outcome: Completed/Met 05/22/2020 0829 by Cameron Ali, RN Outcome: Progressing Goal: Diagnostic test results will improve 05/22/2020 1133 by Cameron Ali, RN Outcome: Completed/Met 05/22/2020 0829 by Cameron Ali, RN Outcome: Progressing Goal: Respiratory complications will improve 05/22/2020 1133 by Cameron Ali, RN Outcome: Completed/Met 05/22/2020 0829 by Cameron Ali, RN Outcome: Progressing Goal: Cardiovascular complication will be avoided 05/22/2020 1133 by Cameron Ali, RN Outcome: Completed/Met 05/22/2020 0829 by Cameron Ali, RN Outcome: Progressing   Problem: Activity: Goal: Risk for activity intolerance will decrease 05/22/2020 1133 by Cameron Ali, RN Outcome: Completed/Met 05/22/2020 0829 by Cameron Ali, RN Outcome: Progressing   Problem: Nutrition: Goal: Adequate nutrition will be maintained 05/22/2020 1133 by Cameron Ali, RN Outcome: Completed/Met 05/22/2020 0829 by Cameron Ali, RN Outcome: Progressing   Problem: Coping: Goal: Level  of anxiety will decrease 05/22/2020 1133 by Cameron Ali, RN Outcome: Completed/Met 05/22/2020 0829 by Cameron Ali, RN Outcome: Progressing   Problem: Elimination: Goal: Will not experience complications related to bowel motility 05/22/2020 1133 by Cameron Ali, RN Outcome: Completed/Met 05/22/2020 0829 by Cameron Ali, RN Outcome: Progressing Goal: Will not experience complications related to urinary retention 05/22/2020 1133 by Cameron Ali, RN Outcome: Completed/Met 05/22/2020 0829 by Cameron Ali, RN Outcome: Progressing   Problem: Pain Managment: Goal: General experience of comfort will improve 05/22/2020 1133 by Cameron Ali, RN Outcome: Completed/Met 05/22/2020 0829 by Cameron Ali, RN Outcome: Progressing   Problem: Safety: Goal: Ability to remain free from injury will improve 05/22/2020 1133 by Cameron Ali, RN Outcome: Completed/Met 05/22/2020 0829 by Cameron Ali, RN Outcome: Progressing   Problem: Skin Integrity: Goal: Risk for impaired skin integrity will decrease 05/22/2020 1133 by Cameron Ali, RN Outcome: Completed/Met 05/22/2020 0829 by Cameron Ali, RN Outcome: Progressing   Problem: Education: Goal: Knowledge of risk factors and measures for prevention of condition will improve 05/22/2020 1133 by Cameron Ali, RN Outcome: Completed/Met 05/22/2020 0829 by Cameron Ali, RN Outcome: Progressing   Problem: Coping: Goal: Psychosocial and spiritual needs will be supported 05/22/2020 1133 by Cameron Ali, RN Outcome: Completed/Met 05/22/2020 0829 by Cameron Ali, RN Outcome: Progressing   Problem: Respiratory: Goal: Will maintain a patent airway 05/22/2020 1133 by Cameron Ali, RN Outcome: Completed/Met 05/22/2020 0829 by Cameron Ali, RN Outcome: Progressing Goal: Complications related to the disease process, condition or treatment will be avoided or  minimized 05/22/2020 1133 by Cameron Ali, RN Outcome: Completed/Met 05/22/2020 0829 by Cameron Ali, RN Outcome: Progressing

## 2020-05-22 NOTE — Plan of Care (Signed)

## 2020-05-22 NOTE — Progress Notes (Signed)
Discharge instructions given to patient. Education on oxygen use given, no new questions at this time. All belongings packed and sent with patient. Daughter will transport patient home.

## 2020-05-22 NOTE — Progress Notes (Signed)
SATURATION QUALIFICATIONS: (This note is used to comply with regulatory documentation for home oxygen)  Patient Saturations on Room Air at Rest = 89%  Patient Saturations on Room Air while Ambulating = 88%  Patient Saturations on 4 Liters of oxygen while Ambulating = 92%  Please briefly explain why patient needs home oxygen:

## 2020-05-22 NOTE — Discharge Summary (Signed)
Physician Discharge Summary  Brent Mercer ZDG:[id number] DOB: [birth year] DOA: 05/20/2020  PCP: Elfredia Nevins, MD  Admit date: 05/20/2020 Discharge date: 05/22/2020  Admitted From:  Home  Disposition:  Home   Recommendations for Outpatient Follow-up:  1. Follow up with PCP in 1 weeks 2. Please wean off oxygen as able  Patient scheduled for outpatient Remdesivir infusion at 11am on Saturday 11/13 at [facility]. Please inform the patient to park at[address], [place], as staff will be escorting the patient through the east entrance of the hospital.Appointments take approximately 45 minutes.   There is a wave flag banner located near the entrance on [address]. Turn into this entranceand immediatelyturn left or right and park in 1 of the 10 designated Covid Infusion Parking spots. There is a phone number on the sign, please call and let the staff know what spot you are in and we will come out and get you. For questions call [phone]. Thanks.  Home Health:  DME Home Oxygen 4L/m  Discharge Condition: STABLE   CODE STATUS: FULL    Brief Hospitalization Summary: Please see all hospital notes, images, labs for full details of the hospitalization. ADMISSION HPI:  Brent Mercer  is a 59 y.o. male, with past medical history of hypertension, he was brought by EMS due to shortness of breath, patient reported tested positive for COVID-19 on 05/12/2020, as he was complaining of body ache, and cough, he does report worsening shortness of breath over last 3 to 4 days, fever, chills, poor appetite, fatigue, patient was noted 91% by EMS on room air, here in ED he was 87% on room air at rest upon my evaluation, vaccinated against Covid, he does report fever, chills, shortness of breath, cough, any hemoptysis, lower extremity edema or pain.  In the ED and was started on 4 L nasal cannula, I tried him on room air where he did drop to 87%, he is currently back on 4 L oxygen, chest  x-ray significant for multifocal opacity, his COVID-19 PCR test is positive, he has elevated AST of 57, ALT of 46, is febrile at 102, his ferritin is at 1400, CRP at 4.5, triage hospitalist consulted to admit.    Hospital Course:    COVID-19 virus infection   Pneumonia due to COVID-19 virus   Prediabetes   1. Covid Pneumonia - Continue supportive measures and IV remdesivir and steroids.  Pt stable on 4L/min [place].  Continue to encourage IS and prone positioning in bed. Iflammatory markers are trending down.  DC home with supplemental oxygen and outpatient remdesivir infusion has been arranged.  2. Elevated LFTs - reactive to Covid infection, following.  3. Essential hypertension - resumed home bystolic.  BPs have been controlled  4. Prediabetes with steroid induced hyperglycemia - temporary Rx for metformin while on steroids.  Follow up with PCP.   DVT prophylaxis:  Enoxaparin  Code Status:  Full  Family Communication: discussed with patient at bedside  Disposition: home  Status is: Inpatient  Remains inpatient appropriate because:Inpatient level of care appropriate due to severity of illness  Discharge Diagnoses:  Active Problems:   COVID-19 virus infection   Pneumonia due to COVID-19 virus   Prediabetes  Discharge Instructions:  Allergies as of 05/22/2020      Reactions   Latex Rash      Medication List    TAKE these medications   Ascorbic Acid 500 MG Caps Take 1 capsule (500 mg total) by mouth daily.  Bystolic 10 MG tablet Generic drug: nebivolol Take 10 mg by mouth daily.   guaiFENesin-dextromethorphan 100-10 MG/5ML syrup Commonly known as: ROBITUSSIN DM Take 10 mLs by mouth every 4 (four) hours as needed for cough.   metFORMIN 500 MG 24 hr tablet Commonly known as: Glucophage XR Take 1 tablet (500 mg total) by mouth daily with supper for 10 days.   omeprazole 20 MG capsule Commonly known as: PriLOSEC Take 1 capsule (20 mg total) by mouth daily for 14  days.   predniSONE 20 MG tablet Commonly known as: DELTASONE Take 2 tablets (40 mg total) by mouth daily with breakfast for 7 days. Start taking on: May 23, 2020   Zinc 220 (50 Zn) MG Caps Take 1 capsule by mouth daily.            Durable Medical Equipment  (From admission, onward)         Start     Ordered   05/22/20 0931  For home use only DME oxygen  Once       Question Answer Comment  Length of Need 6 Months   Mode or (Route) Nasal cannula   Liters per Minute 4   Frequency Continuous (stationary and portable oxygen unit needed)   Oxygen conserving device Yes   Oxygen delivery system Gas      05/22/20 0930          Follow-up Information    Elfredia Nevins, MD. Schedule an appointment as soon as possible for a visit in 1 week(s).   Specialty: Internal Medicine Why: Hospital Follow Up  Contact information: [address] [place] [address] [phone]              Allergies  Allergen Reactions  . Latex Rash   Allergies as of 05/22/2020      Reactions   Latex Rash      Medication List    TAKE these medications   Ascorbic Acid 500 MG Caps Take 1 capsule (500 mg total) by mouth daily.   Bystolic 10 MG tablet Generic drug: nebivolol Take 10 mg by mouth daily.   guaiFENesin-dextromethorphan 100-10 MG/5ML syrup Commonly known as: ROBITUSSIN DM Take 10 mLs by mouth every 4 (four) hours as needed for cough.   metFORMIN 500 MG 24 hr tablet Commonly known as: Glucophage XR Take 1 tablet (500 mg total) by mouth daily with supper for 10 days.   omeprazole 20 MG capsule Commonly known as: PriLOSEC Take 1 capsule (20 mg total) by mouth daily for 14 days.   predniSONE 20 MG tablet Commonly known as: DELTASONE Take 2 tablets (40 mg total) by mouth daily with breakfast for 7 days. Start taking on: May 23, 2020   Zinc 220 (50 Zn) MG Caps Take 1 capsule by mouth daily.            Durable Medical Equipment  (From  admission, onward)         Start     Ordered   05/22/20 0931  For home use only DME oxygen  Once       Question Answer Comment  Length of Need 6 Months   Mode or (Route) Nasal cannula   Liters per Minute 4   Frequency Continuous (stationary and portable oxygen unit needed)   Oxygen conserving device Yes   Oxygen delivery system Gas      05/22/20 0930         Procedures/Studies: [employer] Chest Port 1 View  Result Date:  05/20/2020 CLINICAL DATA:  Shortness of breath.  COVID-19 positive EXAM: PORTABLE CHEST 1 VIEW COMPARISON:  None. FINDINGS: Patchy airspace opacity is noted in each mid and lower lung region. No consolidation. Lungs elsewhere clear. Heart is upper normal in size with pulmonary vascularity normal. No adenopathy. No bone lesions. IMPRESSION: Patchy airspace opacity bilaterally, likely indicative of multifocal atypical organism pneumonia. No consolidation. Heart upper normal in size. No adenopathy evident. Electronically Signed   By: Bretta Bang III M.D.   On: 05/20/2020 14:14      Subjective: Pt without complaints.  He says he is breathing better.  He is ambulating.     Discharge Exam: Vitals:   05/21/20 2135 05/22/20 0549  BP:  (!) 145/95  Pulse:  70  Resp:  18  Temp:  98.2 F (36.8 C)  SpO2: (!) 87% 90%   Vitals:   05/21/20 2128 05/21/20 2128 05/21/20 2135 05/22/20 0549  BP: (!) 146/98 (!) 146/98  (!) 145/95  Pulse: 74 74  70  Resp: Temp:    98.2 F (36.8 C)  TempSrc:      SpO2: 93% 93% (!) 87% 90%  Weight:      Height:       General: Pt is alert, awake, not in acute distress Cardiovascular: normal S1/S2 +, no rubs, no gallops Respiratory: no increased work of breathing, no wheezing, no rhonchi Abdominal: Soft, NT, ND, bowel sounds + Extremities: no edema, no cyanosis   The results of significant diagnostics from this hospitalization (including imaging, microbiology, ancillary and laboratory) are listed below for reference.      Microbiology: Recent Results (from the past 240 hour(s))  Blood Culture (routine x 2)     Status: None (Preliminary result)   Collection Time: 05/20/20  2:08 PM   Specimen: BLOOD  Result Value Ref Range Status   Specimen Description BLOOD RIGHT ANTECUBITAL  Final   Special Requests   Final    BOTTLES DRAWN AEROBIC AND ANAEROBIC Blood Culture results may not be optimal due to an inadequate volume of blood received in culture bottles   Culture   Final    NO GROWTH < 24 HOURS Performed at [facility], [address] [place], [place] [address]    Report Status PENDING  Incomplete  Respiratory Panel by RT PCR (Flu A&B, Covid) - Nasopharyngeal Swab     Status: Abnormal   Collection Time: 05/20/20  2:08 PM   Specimen: Nasopharyngeal Swab  Result Value Ref Range Status   SARS Coronavirus 2 by RT PCR POSITIVE (A) NEGATIVE Final    Comment: RESULT CALLED TO, READ BACK BY AND VERIFIED WITH: CUGINOM,M ON 05/20/20 AT 1610 BY LOY,C (NOTE) SARS-CoV-2 target nucleic acids are DETECTED.  SARS-CoV-2 RNA is generally detectable in upper respiratory specimens  during the acute phase of infection. Positive results are indicative of the presence of the identified virus, but do not rule out bacterial infection or co-infection with other pathogens not detected by the test. Clinical correlation with patient history and other diagnostic information is necessary to determine patient infection status. The expected result is Negative.  Fact Sheet for Patients:  https://www.moore.com/  Fact Sheet for Healthcare Providers: https://www.young.biz/  This test is not yet approved or cleared by the [place] FDA and  has been authorized for detection and/or diagnosis of SARS-CoV-2 by FDA under an Emergency Use Authorization (EUA).  This EUA will remain in effect (meaning this test can  be used) for  the duration of  the COVID-19 declaration under Section  564(b)(1) of the Act, 21 U.S.C. section 360bbb-3(b)(1), unless the authorization is terminated or revoked sooner.      Influenza A by PCR NEGATIVE NEGATIVE Final   Influenza B by PCR NEGATIVE NEGATIVE Final    Comment: (NOTE) The Xpert Xpress SARS-CoV-2/FLU/RSV assay is intended as an aid in  the diagnosis of influenza from Nasopharyngeal swab specimens and  should not be used as a sole basis for treatment. Nasal washings and  aspirates are unacceptable for Xpert Xpress SARS-CoV-2/FLU/RSV  testing.  Fact Sheet for Patients: https://www.moore.com/  Fact Sheet for Healthcare Providers: https://www.young.biz/  This test is not yet approved or cleared by the [place] FDA and  has been authorized for detection and/or diagnosis of SARS-CoV-2 by  FDA under an Emergency Use Authorization (EUA). This EUA will remain  in effect (meaning this test can be used) for the duration of the  Covid-19 declaration under Section 564(b)(1) of the Act, 21  U.S.C. section 360bbb-3(b)(1), unless the authorization is  terminated or revoked. Performed at [facility], [address] [place], [place] [address]   Blood Culture (routine x 2)     Status: None (Preliminary result)   Collection Time: 05/20/20  2:24 PM   Specimen: BLOOD RIGHT HAND  Result Value Ref Range Status   Specimen Description BLOOD RIGHT HAND  Final   Special Requests   Final    BOTTLES DRAWN AEROBIC AND ANAEROBIC Blood Culture results may not be optimal due to an inadequate volume of blood received in culture bottles   Culture   Final    NO GROWTH < 24 HOURS Performed at [facility], [address], [place], [place] [address]    Report Status PENDING  Incomplete     Labs: BNP (last 3 results) No results for input(s): BNP in the last 8760 hours. Basic Metabolic Panel: Recent Labs  Lab 05/20/20 1408 05/21/20 0524 05/22/20 0630  NA 135 137 138  K 3.7 4.3 4.1  CL 96* 99 99  CO2 27  26 28   GLUCOSE 125* 202* 212*  BUN 16 22* 26*  CREATININE 0.94 0.96 0.94  CALCIUM 8.4* 8.4* 8.3*   Liver Function Tests: Recent Labs  Lab 05/20/20 1408 05/21/20 0524 05/22/20 0630  AST 57* 59* 51*  ALT 46* 53* 57*  ALKPHOS 58 57 52  BILITOT 0.8 0.7 0.7  PROT 7.3 7.1 6.4*  ALBUMIN 3.7 3.4* 3.2*   No results for input(s): LIPASE, AMYLASE in the last 168 hours. No results for input(s): AMMONIA in the last 168 hours. CBC: Recent Labs  Lab 05/20/20 1408 05/21/20 0524 05/22/20 0630  WBC 3.9* 2.4* 5.9  NEUTROABS 2.7 1.4* 3.5  HGB 17.0 16.1 15.1  HCT 52.0 49.9 46.1  MCV 93.7 95.4 93.7  PLT 139* 149* 181   Cardiac Enzymes: No results for input(s): CKTOTAL, CKMB, CKMBINDEX, TROPONINI in the last 168 hours. BNP: Invalid input(s): POCBNP CBG: Recent Labs  Lab 05/21/20 0748 05/21/20 1128 05/21/20 1622 05/21/20 2128 05/22/20 0750  GLUCAP 189* 243* 213* 222* 222*   D-Dimer Recent Labs    05/21/20 0524 05/22/20 0630  DDIMER 0.71* 0.30   Hgb A1c Recent Labs    05/21/20 0524  HGBA1C 6.3*   Lipid Profile Recent Labs    05/20/20 1408  TRIG 193*   Thyroid function studies No results for input(s): TSH, T4TOTAL, T3FREE, THYROIDAB in the last 72 hours.  Invalid input(s): FREET3 Anemia work up Recent  Labs    05/20/20 1408  FERRITIN 1,400*   Urinalysis No results found for: COLORURINE, APPEARANCEUR, LABSPEC, PHURINE, GLUCOSEU, HGBUR, BILIRUBINUR, KETONESUR, PROTEINUR, UROBILINOGEN, NITRITE, LEUKOCYTESUR Sepsis Labs Invalid input(s): PROCALCITONIN,  WBC,  LACTICIDVEN Microbiology Recent Results (from the past 240 hour(s))  Blood Culture (routine x 2)     Status: None (Preliminary result)   Collection Time: 05/20/20  2:08 PM   Specimen: BLOOD  Result Value Ref Range Status   Specimen Description BLOOD RIGHT ANTECUBITAL  Final   Special Requests   Final    BOTTLES DRAWN AEROBIC AND ANAEROBIC Blood Culture results may not be optimal due to an inadequate  volume of blood received in culture bottles   Culture   Final    NO GROWTH < 24 HOURS Performed at [facility], [address], [place], [place] [address]    Report Status PENDING  Incomplete  Respiratory Panel by RT PCR (Flu A&B, Covid) - Nasopharyngeal Swab     Status: Abnormal   Collection Time: 05/20/20  2:08 PM   Specimen: Nasopharyngeal Swab  Result Value Ref Range Status   SARS Coronavirus 2 by RT PCR POSITIVE (A) NEGATIVE Final    Comment: RESULT CALLED TO, READ BACK BY AND VERIFIED WITH: CUGINOM,M ON 05/20/20 AT 1610 BY LOY,C (NOTE) SARS-CoV-2 target nucleic acids are DETECTED.  SARS-CoV-2 RNA is generally detectable in upper respiratory specimens  during the acute phase of infection. Positive results are indicative of the presence of the identified virus, but do not rule out bacterial infection or co-infection with other pathogens not detected by the test. Clinical correlation with patient history and other diagnostic information is necessary to determine patient infection status. The expected result is Negative.  Fact Sheet for Patients:  https://www.moore.com/  Fact Sheet for Healthcare Providers: https://www.young.biz/  This test is not yet approved or cleared by the [place] FDA and  has been authorized for detection and/or diagnosis of SARS-CoV-2 by FDA under an Emergency Use Authorization (EUA).  This EUA will remain in effect (meaning this test can  be used) for the duration of  the COVID-19 declaration under Section 564(b)(1) of the Act, 21 U.S.C. section 360bbb-3(b)(1), unless the authorization is terminated or revoked sooner.      Influenza A by PCR NEGATIVE NEGATIVE Final   Influenza B by PCR NEGATIVE NEGATIVE Final    Comment: (NOTE) The Xpert Xpress SARS-CoV-2/FLU/RSV assay is intended as an aid in  the diagnosis of influenza from Nasopharyngeal swab specimens and  should not be used as a sole basis for  treatment. Nasal washings and  aspirates are unacceptable for Xpert Xpress SARS-CoV-2/FLU/RSV  testing.  Fact Sheet for Patients: https://www.moore.com/  Fact Sheet for Healthcare Providers: https://www.young.biz/  This test is not yet approved or cleared by the [place] FDA and  has been authorized for detection and/or diagnosis of SARS-CoV-2 by  FDA under an Emergency Use Authorization (EUA). This EUA will remain  in effect (meaning this test can be used) for the duration of the  Covid-19 declaration under Section 564(b)(1) of the Act, 21  U.S.C. section 360bbb-3(b)(1), unless the authorization is  terminated or revoked. Performed at [facility], [address], [place], [place] [address]   Blood Culture (routine x 2)     Status: None (Preliminary result)   Collection Time: 05/20/20  2:24 PM   Specimen: BLOOD RIGHT HAND  Result Value Ref Range Status   Specimen Description BLOOD RIGHT HAND  Final   Special Requests  Final    BOTTLES DRAWN AEROBIC AND ANAEROBIC Blood Culture results may not be optimal due to an inadequate volume of blood received in culture bottles   Culture   Final    NO GROWTH < 24 HOURS Performed at [facility], [address] [place], [place] [id number]    Report Status PENDING  Incomplete   Time coordinating discharge: 35 minutes   SIGNED:  Standley Dakinslanford Shantil Vallejo, MD  [facility] 05/22/2020, 11:13 AM How to contact the [facility] Attending or Consulting provider 7A - 7P or covering provider during after hours 7P -7A, for this patient?  1. Check the care team in [facility] and look for a) attending/consulting [facility] provider listed and b) the [facility] team listed 2. Log into [email or web address] and use [place]'s universal password to access. If you do not have the password, please contact the hospital operator. 3. Locate the [facility] provider you are looking for under [facility] and page to a number that you can be directly  reached. 4. If you still have difficulty reaching the provider, please page the [facility] (Director on Call) for the Hospitalists listed on amion for assistance.

## 2020-05-23 ENCOUNTER — Ambulatory Visit (HOSPITAL_COMMUNITY)
Admit: 2020-05-23 | Discharge: 2020-05-23 | Disposition: A | Discharge status: Home / Self Care | Payer: BC Managed Care – PPO | Attending: Pulmonary Disease | Admitting: Pulmonary Disease

## 2020-05-23 DIAGNOSIS — U071 COVID-19: Secondary | ICD-10-CM | POA: Diagnosis not present

## 2020-05-23 MED ORDER — FAMOTIDINE IN NACL 20-0.9 MG/50ML-% IV SOLN: 20.0000 mg | Freq: Once | INTRAVENOUS | Status: DC | PRN

## 2020-05-23 MED ORDER — EPINEPHRINE 0.3 MG/0.3ML IJ SOAJ: 0.3000 mg | Freq: Once | INTRAMUSCULAR | Status: DC | PRN

## 2020-05-23 MED ORDER — DIPHENHYDRAMINE HCL 50 MG/ML IJ SOLN: 50.0000 mg | Freq: Once | INTRAMUSCULAR | Status: DC | PRN

## 2020-05-23 MED ORDER — ALBUTEROL SULFATE HFA 108 (90 BASE) MCG/ACT IN AERS: 2.0000 | INHALATION_SPRAY | Freq: Once | RESPIRATORY_TRACT | Status: DC | PRN

## 2020-05-23 MED ORDER — METHYLPREDNISOLONE SODIUM SUCC 125 MG IJ SOLR: 125.0000 mg | Freq: Once | INTRAMUSCULAR | Status: DC | PRN

## 2020-05-23 MED ORDER — SODIUM CHLORIDE 0.9 % IV SOLN
100.0000 mg | Freq: Once | INTRAVENOUS | Status: AC
  Administered 2020-05-23: 100 mg via INTRAVENOUS

## 2020-05-23 MED ORDER — SODIUM CHLORIDE 0.9 % IV SOLN: INTRAVENOUS | Status: DC | PRN

## 2020-05-23 NOTE — Progress Notes (Signed)
  Diagnosis: COVID-19  Physician: Dr. Shan Levans  Procedure: Allergies reviewed.  Remdesivir (5th infusion) administered via IV infusion.   Discharge instructions provided to patient; all questions answered.   Complications: No immediate complications noted.  Discharge: Discharged home   Gregary Signs 05/23/2020

## 2020-05-23 NOTE — Discharge Instructions (Signed)

## 2020-05-25 LAB — CULTURE, BLOOD (ROUTINE X 2)
Culture: NO GROWTH
Culture: NO GROWTH

## 2020-05-28 DIAGNOSIS — R7309 Other abnormal glucose: Secondary | ICD-10-CM | POA: Diagnosis not present

## 2020-05-28 DIAGNOSIS — Z6826 Body mass index (BMI) 26.0-26.9, adult: Secondary | ICD-10-CM | POA: Diagnosis not present

## 2020-05-28 DIAGNOSIS — I10 Essential (primary) hypertension: Secondary | ICD-10-CM | POA: Diagnosis not present

## 2020-05-28 DIAGNOSIS — N400 Enlarged prostate without lower urinary tract symptoms: Secondary | ICD-10-CM | POA: Diagnosis not present

## 2020-05-28 DIAGNOSIS — R7303 Prediabetes: Secondary | ICD-10-CM | POA: Diagnosis not present

## 2020-06-21 DIAGNOSIS — U071 COVID-19: Secondary | ICD-10-CM | POA: Diagnosis not present

## 2020-06-21 DIAGNOSIS — J1282 Pneumonia due to coronavirus disease 2019: Secondary | ICD-10-CM | POA: Diagnosis not present

## 2020-07-06 DIAGNOSIS — J22 Unspecified acute lower respiratory infection: Secondary | ICD-10-CM | POA: Diagnosis not present

## 2020-07-06 DIAGNOSIS — Z681 Body mass index (BMI) 19 or less, adult: Secondary | ICD-10-CM | POA: Diagnosis not present

## 2020-07-21 DIAGNOSIS — Z4889 Encounter for other specified surgical aftercare: Secondary | ICD-10-CM | POA: Diagnosis not present

## 2020-07-22 DIAGNOSIS — J1282 Pneumonia due to coronavirus disease 2019: Secondary | ICD-10-CM | POA: Diagnosis not present

## 2020-07-22 DIAGNOSIS — U071 COVID-19: Secondary | ICD-10-CM | POA: Diagnosis not present

## 2020-08-22 DIAGNOSIS — J1282 Pneumonia due to coronavirus disease 2019: Secondary | ICD-10-CM | POA: Diagnosis not present

## 2020-08-22 DIAGNOSIS — U071 COVID-19: Secondary | ICD-10-CM | POA: Diagnosis not present

## 2020-09-01 ENCOUNTER — Ambulatory Visit (INDEPENDENT_AMBULATORY_CARE_PROVIDER_SITE_OTHER): Payer: BC Managed Care – PPO

## 2020-09-01 ENCOUNTER — Encounter: Payer: Self-pay | Admitting: Emergency Medicine

## 2020-09-01 ENCOUNTER — Ambulatory Visit
Admission: EM | Admit: 2020-09-01 | Discharge: 2020-09-01 | Disposition: A | Discharge status: Home / Self Care | Payer: BC Managed Care – PPO | Attending: Emergency Medicine | Admitting: Emergency Medicine

## 2020-09-01 ENCOUNTER — Other Ambulatory Visit: Payer: Self-pay

## 2020-09-01 DIAGNOSIS — M79644 Pain in right finger(s): Secondary | ICD-10-CM | POA: Diagnosis not present

## 2020-09-01 DIAGNOSIS — S6701XA Crushing injury of right thumb, initial encounter: Secondary | ICD-10-CM | POA: Diagnosis not present

## 2020-09-01 DIAGNOSIS — Y990 Civilian activity done for income or pay: Secondary | ICD-10-CM

## 2020-09-01 DIAGNOSIS — S60931A Unspecified superficial injury of right thumb, initial encounter: Secondary | ICD-10-CM | POA: Diagnosis not present

## 2020-09-01 DIAGNOSIS — M19041 Primary osteoarthritis, right hand: Secondary | ICD-10-CM | POA: Diagnosis not present

## 2020-09-01 NOTE — ED Triage Notes (Signed)
RT thumb got smashed in a machine at work around [employer] today.  Pt has full ROM and no decreased sensation, bruising is visible.

## 2020-09-01 NOTE — ED Provider Notes (Addendum)
[facility]   [id number] 09/01/20 Arrival Time: 1753   Chief Complaint  Patient presents with  . Finger Injury     SUBJECTIVE: History from: patient.  Brent Mercer is a 60 y.o. male who presented to the urgent care with a complaint of right thumb pain that occurred today.  Developed the symptom after his hand was stuck and smashed in a machine at work.  He localizes the pain to the right thumb.  He describes the pain as constant and achy.  He has tried OTC medications without relief.  His symptoms are made worse with ROM.  He denies similar symptoms in the past.  Denies chills, nausea, vomiting, diarrhea  ROS: As per HPI.  All other pertinent ROS negative.      Past Medical History:  Diagnosis Date  . Headache   . Hypertension    Past Surgical History:  Procedure Laterality Date  . COLONOSCOPY N/A 08/11/2015   Procedure: COLONOSCOPY;  Surgeon: Franky Macho, MD;  Location: [facility];  Service: Gastroenterology;  Laterality: N/A;  . CYSTOSCOPY WITH INSERTION OF UROLIFT    . FOOT SURGERY Left   . HERNIA REPAIR    . NO PAST SURGERIES    . PLANTAR FASCIA RELEASE Left 02/12/2020   Procedure: OPEN PLANTAR FASCIOTOMY LEFT FOOT;  Surgeon: Ferman Hamming, DPM;  Location: [facility] ORS;  Service: Podiatry;  Laterality: Left;   Allergies  Allergen Reactions  . Latex Rash   No current facility-administered medications on file prior to encounter.   Current Outpatient Medications on File Prior to Encounter  Medication Sig Dispense Refill  . Ascorbic Acid 500 MG CAPS Take 1 capsule (500 mg total) by mouth daily. 60 capsule 0  . BYSTOLIC 10 MG tablet Take 10 mg by mouth daily.     Marland Kitchen guaiFENesin-dextromethorphan (ROBITUSSIN DM) 100-10 MG/5ML syrup Take 10 mLs by mouth every 4 (four) hours as needed for cough. 118 mL 0  . metFORMIN (GLUCOPHAGE XR) 500 MG 24 hr tablet Take 1 tablet (500 mg total) by mouth daily with supper for 10 days. 10 tablet 0  . omeprazole (PRILOSEC) 20 MG  capsule Take 1 capsule (20 mg total) by mouth daily for 14 days. 14 capsule 0  . Zinc 220 (50 Zn) MG CAPS Take 1 capsule by mouth daily. 14 capsule 0   Social History   Socioeconomic History  . Marital status: Married    Spouse name: Not on file  . Number of children: Not on file  . Years of education: Not on file  . Highest education level: Not on file  Occupational History  . Not on file  Tobacco Use  . Smoking status: Never Smoker  . Smokeless tobacco: Never Used  Vaping Use  . Vaping Use: Never used  Substance and Sexual Activity  . Alcohol use: No  . Drug use: No  . Sexual activity: Not on file  Other Topics Concern  . Not on file  Social History Narrative  . Not on file   Social Determinants of Health   Financial Resource Strain: Not on file  Food Insecurity: Not on file  Transportation Needs: Not on file  Physical Activity: Not on file  Stress: Not on file  Social Connections: Not on file  Intimate Partner Violence: Not on file   Family History  Problem Relation Age of Onset  . Colon cancer Other     OBJECTIVE:  Vitals:   09/01/20 1803  BP: 136/81  Pulse: 73  Resp: 18  Temp: 99.4 F (37.4 C)  TempSrc: Oral  SpO2: 95%     Physical Exam Vitals and nursing note reviewed.  Constitutional:      General: He is not in acute distress.    Appearance: Normal appearance. He is normal weight. He is not ill-appearing, toxic-appearing or diaphoretic.  Cardiovascular:     Rate and Rhythm: Normal rate and regular rhythm.     Pulses: Normal pulses.     Heart sounds: Normal heart sounds. No murmur heard. No friction rub. No gallop.   Pulmonary:     Effort: Pulmonary effort is normal. No respiratory distress.     Breath sounds: Normal breath sounds. No stridor. No wheezing, rhonchi or rales.  Chest:     Chest wall: No tenderness.  Musculoskeletal:        General: Tenderness present.     Right hand: Swelling and tenderness present.     Left hand: Normal.      Comments: The right thumb is with obvious deformity compared to the left arm.  Ecchymosis and swelling present.  There is no surface trauma, or lesion present.  Limited range of motion with pain.  Neurovascular status intact.  Neurological:     Mental Status: He is alert and oriented to person, place, and time.     LABS:  No results found for this or any previous visit (from the past 24 hour(s)).   RADIOLOGY:  DG Finger Thumb Right  Result Date: 09/01/2020 CLINICAL DATA:  Crush injury EXAM: RIGHT THUMB 2+V COMPARISON:  None. FINDINGS: No fracture or malalignment. No radiopaque foreign body in the soft tissues. Degenerative changes at the IP and first CMC joints. IMPRESSION: No acute osseous abnormality. Electronically Signed   By: Jasmine Pang M.D.   On: 09/01/2020 19:09     Right thumb x-ray is negative for bony abnormality including fracture or dislocation.  I have reviewed the x-ray myself and the radiologist interpretation.  I am in agreement with the radiologist interpretation.   ASSESSMENT & PLAN:  1. Pain of right thumb   2. Work related injury     No orders of the defined types were placed in this encounter.   Discharge Instructions   Take OTC Tylenol/ibuprofen as needed for pain Follow RICE instruction days attached Follow-up with PCP Return or go to ED if you develop any new or worsening of your symptoms  Reviewed expectations re: course of current medical issues. Questions answered. Outlined signs and symptoms indicating need for more acute intervention. Patient verbalized understanding. After Visit Summary given.         Durward Parcel, FNP 09/01/20 1916    Durward Parcel, FNP 09/01/20 [phone]

## 2020-09-01 NOTE — Discharge Instructions (Addendum)
Take OTC Tylenol/ibuprofen as needed for pain Follow RICE instruction days attached Follow-up with PCP Return or go to ED if you develop any new or worsening of your symptoms

## 2020-09-04 ENCOUNTER — Other Ambulatory Visit: Payer: Self-pay

## 2020-09-04 ENCOUNTER — Ambulatory Visit (INDEPENDENT_AMBULATORY_CARE_PROVIDER_SITE_OTHER): Payer: BC Managed Care – PPO | Admitting: Urology

## 2020-09-04 ENCOUNTER — Encounter: Payer: Self-pay | Admitting: Urology

## 2020-09-04 VITALS — BP 132/77 | HR 78 | Temp 99.3°F | Ht 71.0 in | Wt 189.0 lb

## 2020-09-04 DIAGNOSIS — N5201 Erectile dysfunction due to arterial insufficiency: Secondary | ICD-10-CM | POA: Diagnosis not present

## 2020-09-04 DIAGNOSIS — N401 Enlarged prostate with lower urinary tract symptoms: Secondary | ICD-10-CM | POA: Diagnosis not present

## 2020-09-04 LAB — URINALYSIS, ROUTINE W REFLEX MICROSCOPIC
Bilirubin, UA: NEGATIVE
Glucose, UA: NEGATIVE
Ketones, UA: NEGATIVE
Leukocytes,UA: NEGATIVE
Nitrite, UA: NEGATIVE
Protein,UA: NEGATIVE
RBC, UA: NEGATIVE
Specific Gravity, UA: >1.03 — ABNORMAL HIGH (ref 1.005–1.030)
Urobilinogen, Ur: 0.2 mg/dL (ref 0.2–1.0)
pH, UA: 5.5 (ref 5.0–7.5)

## 2020-09-04 MED ORDER — SILDENAFIL CITRATE 20 MG PO TABS: 20.0000 mg | ORAL_TABLET | ORAL | 3 refills | Status: DC | PRN

## 2020-09-04 NOTE — Progress Notes (Signed)
09/04/2020 1:36 PM   Brent Mercer [birth year] [id number]  Referring provider: Elfredia Nevins, MD [address] [place],  [place] [address]  followup BPH and erectile dysfunction  HPI: Brent Mercer is a 59yo here for followup for BPH and ED. He has mild LUTS after urolift. Nocturia 0-1x. IPSS 3 with QOL 1. He has erectile dysfunction for which he uses sildenafil 20mg  prn with good results. No other complaints today  His records from [facility] are as follows: I have an enlarged prostate (follow-up).  HPI: Brent Mercer is a 60 year-old male established patient who is here for an enlarged prostate follow-up evaluation.  He is currently taking none. He is not on new medications for symptoms of prostate enlargement.   He does not have an abnormal sensation when needing to urinate. He is not having problems getting his urine stream started. He does have a good size and strength to his urinary stream. He is not having problems with emptying his bladder well. He does not dribble at the end of urination.   10/19/2016: He has been on flomax, prescribed by Dr. 12/19/2016, for urinary urgency, frequency and nocturia.   04/26/2017: He notes significant improvement in his LUTS on flomax daily. He has a weak stream in the morning.   12/13/2017: He is on flomax 0.4mg  with stable LUTS. He notes if he misses a dose he has difficulty urinating   06/20/2018: He is currently on flomax 0.4mg  with stable LUTS. He is unhappy with his urination. He is also taking saw palmetto.   10/01/2018: The patient is here for Urolift   10/03/2018: s/p urolift. no pain. no hematuria. voiding trial passed today   10/18/2018: He is urinating well except new urgency. He is bothered by the urgency   01/17/2019: He is urinating well since Urolift except for urinary urgency. he is taking oxybutynin 15mg  daily which has resolved the urgency.   04/25/2019: Stream is strong, rare urgency, Nocturia 1x.     CC: I have urinary urgency.   HPI: He does have urgency. He does not have problems getting to the bathroom in time after he has the urge to urinate. The condition started approximately 10/10/2018. His symptoms have gotten worse over the last year.   He does not wear protective pads. He generally urinates every hour in the daytime. Patient denies getting up to urinate in the night. He is not having problems with emptying his bladder well.   01/17/2019: The patient developed worsening urgency which is slowly improving. He is currently taking oxybutynin   04/25/2019: He has stopped the oxybutynin. Urgency is rare.     CC: I am having trouble with my erections.  HPI: He first stated noticing pain on approximately 04/10/2014. His symptoms did begin gradually. His symptoms have been stable over the last year.   He does have difficulties achieving an erection. He does have problems maintaining his erections. His erections are straight. He has tried Viagra. It did work.   He does not have premature ejaculation. He does not have trouble reaching climax. He does not have anxiety because of the symptoms.   04/26/2017: He notes good results with 40-60mg  of sildenafil.   12/13/2017: He notes stable ED with taking sildenafil 40-60mg    06/20/2018: He takes sildenafil 40-60mg  with good results   04/25/2019: He has stable ED. He takes 40-60mg  sildenafil with good results     PMH: Past Medical History:  Diagnosis Date  . Headache   . Hypertension  Surgical History: Past Surgical History:  Procedure Laterality Date  . COLONOSCOPY N/A 08/11/2015   Procedure: COLONOSCOPY;  Surgeon: Franky Macho, MD;  Location: [facility];  Service: Gastroenterology;  Laterality: N/A;  . CYSTOSCOPY WITH INSERTION OF UROLIFT    . FOOT SURGERY Left   . HERNIA REPAIR    . NO PAST SURGERIES    . PLANTAR FASCIA RELEASE Left 02/12/2020   Procedure: OPEN PLANTAR FASCIOTOMY LEFT FOOT;  Surgeon: Ferman Hamming, DPM;  Location: [facility];  Service:  Podiatry;  Laterality: Left;    Home Medications:  Allergies as of 09/04/2020      Reactions   Latex Rash      Medication List       Accurate as of September 04, 2020  1:36 PM. If you have any questions, ask your nurse or doctor.        STOP taking these medications   Ascorbic Acid 500 MG Caps Stopped by: Wilkie Aye, MD   guaiFENesin-dextromethorphan 100-10 MG/5ML syrup Commonly known as: ROBITUSSIN DM Stopped by: Wilkie Aye, MD   metFORMIN 500 MG 24 hr tablet Commonly known as: Glucophage XR Stopped by: Wilkie Aye, MD   omeprazole 20 MG capsule Commonly known as: PriLOSEC Stopped by: Wilkie Aye, MD   Zinc 220 (50 Zn) MG Caps Stopped by: Wilkie Aye, MD     TAKE these medications   Bystolic 10 MG tablet Generic drug: nebivolol Take 10 mg by mouth daily.       Allergies:  Allergies  Allergen Reactions  . Latex Rash    Family History: Family History  Problem Relation Age of Onset  . Colon cancer Other     Social History:  reports that he has never smoked. He has never used smokeless tobacco. He reports that he does not drink alcohol and does not use drugs.  ROS: All other review of systems were reviewed and are negative except what is noted above in HPI  Physical Exam: BP 132/77   Pulse 78   Temp 99.3 F (37.4 C)   Ht 5\' 11"  (1.803 m)   Wt 189 lb (85.7 kg)   BMI 26.36 kg/m   Constitutional:  Alert and oriented, No acute distress. HEENT: [place] AT, moist mucus membranes.  Trachea midline, no masses. Cardiovascular: No clubbing, cyanosis, or edema. Respiratory: Normal respiratory effort, no increased work of breathing. GI: Abdomen is soft, nontender, nondistended, no abdominal masses GU: No CVA tenderness.  Lymph: No cervical or inguinal lymphadenopathy. Skin: No rashes, bruises or suspicious lesions. Neurologic: Grossly intact, no focal deficits, moving all 4 extremities. Psychiatric: Normal mood and  affect.  Laboratory Data: Lab Results  Component Value Date   WBC 5.9 05/22/2020   HGB 15.1 05/22/2020   HCT 46.1 05/22/2020   MCV 93.7 05/22/2020   PLT 181 05/22/2020    Lab Results  Component Value Date   CREATININE 0.94 05/22/2020    No results found for: PSA  No results found for: TESTOSTERONE  Lab Results  Component Value Date   HGBA1C 6.3 (H) 05/21/2020    Urinalysis No results found for: COLORURINE, APPEARANCEUR, LABSPEC, PHURINE, GLUCOSEU, HGBUR, BILIRUBINUR, KETONESUR, PROTEINUR, UROBILINOGEN, NITRITE, LEUKOCYTESUR  No results found for: LABMICR, WBCUA, RBCUA, LABEPIT, MUCUS, BACTERIA  Pertinent Imaging:  No results found for this or any previous visit.  No results found for this or any previous visit.  No results found for this or any previous visit.  No results found for this or any previous visit.  No results found for this or any previous visit.  No results found for this or any previous visit.  No results found for this or any previous visit.  No results found for this or any previous visit.   Assessment & Plan:    1. Erectile dysfunction due to arterial insufficiency -sildenafil 20mg  prn  2. Benign localized prostatic hyperplasia with lower urinary tract symptoms (LUTS) -improved after Urolift - Urinalysis, Routine w reflex microscopic   No follow-ups on file.  , MD  [facility] [place]

## 2020-09-04 NOTE — Patient Instructions (Signed)
Erectile Dysfunction Erectile dysfunction (ED) is the inability to get or keep an erection in order to have sexual intercourse. ED is considered a symptom of an underlying disorder and not considered a disease. Erectile dysfunction may include:  Inability to get an erection.  Lack of enough hardness of the erection to allow penetration.  Loss of the erection before sex is finished. What are the causes? This condition may be caused by:  Certain medicines, such as: ? Pain relievers. ? Antihistamines. ? Antidepressants. ? Blood pressure medicines. ? Water pills (diuretics). ? Ulcer medicines. ? Muscle relaxants. ? Drugs.  Excessive drinking.  Psychological causes, such as: ? Anxiety. ? Depression. ? Sadness. ? Exhaustion. ? Performance fear. ? Stress.  Physical causes, such as: ? Artery problems. This may include diabetes, smoking, liver disease, or atherosclerosis. ? High blood pressure. ? Hormonal problems, such as low testosterone. ? Obesity. ? Nerve problems. This may include back or pelvic injuries, diabetes mellitus, multiple sclerosis, or Parkinson's disease. What are the signs or symptoms? Symptoms of this condition include:  Inability to get an erection.  Lack of enough hardness of the erection to allow penetration.  Loss of the erection before sex is finished.  Normal erections at some times, but with frequent unsatisfactory episodes.  Low sexual satisfaction in either partner due to erection problems.  A curved penis occurring with erection. The curve may cause pain or the penis may be too curved to allow for intercourse.  Never having nighttime erections. How is this diagnosed? This condition is often diagnosed by:  Performing a physical exam to find other diseases or specific problems with the penis.  Asking you detailed questions about the problem.  Performing blood tests to check for diabetes mellitus or to measure hormone levels.  Performing  other tests to check for underlying health conditions.  Performing an ultrasound exam to check for scarring.  Performing a test to check blood flow to the penis.  Doing a sleep study at home to measure nighttime erections. How is this treated? This condition may be treated by:  Medicine taken by mouth to help you achieve an erection (oral medicine).  Hormone replacement therapy to replace low testosterone levels.  Medicine that is injected into the penis. Your health care provider may instruct you how to give yourself these injections at home.  Vacuum pump. This is a pump with a ring on it. The pump and ring are placed on the penis and used to create pressure that helps the penis become erect.  Penile implant surgery. In this procedure, you may receive: ? An inflatable implant. This consists of cylinders, a pump, and a reservoir. The cylinders can be inflated with a fluid that helps to create an erection, and they can be deflated after intercourse. ? A semi-rigid implant. This consists of two silicone rubber rods. The rods provide some rigidity. They are also flexible, so the penis can both curve downward in its normal position and become straight for sexual intercourse.  Blood vessel surgery, to improve blood flow to the penis. During this procedure, a blood vessel from a different part of the body is placed into the penis to allow blood to flow around (bypass) damaged or blocked blood vessels.  Lifestyle changes, such as exercising more, losing weight, and quitting smoking. Follow these instructions at home: Medicines  Take over-the-counter and prescription medicines only as told by your health care provider. Do not increase the dosage without first discussing it with your health care   provider.  If you are using self-injections, perform injections as directed by your health care provider. Make sure to avoid any veins that are on the surface of the penis. After giving an injection,  apply pressure to the injection site for 5 minutes.   General instructions  Exercise regularly, as directed by your health care provider. Work with your health care provider to lose weight, if needed.  Do not use any products that contain nicotine or tobacco, such as cigarettes and e-cigarettes. If you need help quitting, ask your health care provider.  Before using a vacuum pump, read the instructions that come with the pump and discuss any questions with your health care provider.  Keep all follow-up visits as told by your health care provider. This is important. Contact a health care provider if:  You feel nauseous.  You vomit. Get help right away if:  You are taking oral or injectable medicines and you have an erection that lasts longer than 4 hours. If your health care provider is unavailable, go to the nearest emergency room for evaluation. An erection that lasts much longer than 4 hours can result in permanent damage to your penis.  You have severe pain in your groin or abdomen.  You develop redness or severe swelling of your penis.  You have redness spreading up into your groin or lower abdomen.  You are unable to urinate.  You experience chest pain or a rapid heart beat (palpitations) after taking oral medicines. Summary  Erectile dysfunction (ED) is the inability to get or keep an erection during sexual intercourse. This problem can usually be treated successfully.  This condition is diagnosed based on a physical exam, your symptoms, and tests to determine the cause. Treatment varies depending on the cause and may include medicines, hormone therapy, surgery, or a vacuum pump.  You may need follow-up visits to make sure that you are using your medicines or devices correctly.  Get help right away if you are taking or injecting medicines and you have an erection that lasts longer than 4 hours. This information is not intended to replace advice given to you by your health  care provider. Make sure you discuss any questions you have with your health care provider. Document Revised: 03/13/2020 Document Reviewed: 03/13/2020 Elsevier Patient Education  2021 Elsevier Inc[employer]  

## 2020-09-04 NOTE — Progress Notes (Signed)
Urological Symptom Review  Patient is experiencing the following symptoms: Weak stream Erection problems (male only)   Review of Systems  Gastrointestinal (upper)  : Negative for upper GI symptoms  Gastrointestinal (lower) : Negative for lower GI symptoms  Constitutional : Negative for symptoms  Skin: Negative for skin symptoms  Eyes: Negative for eye symptoms  Ear/Nose/Throat : Negative for Ear/Nose/Throat symptoms  Hematologic/Lymphatic: Negative for Hematologic/Lymphatic symptoms  Cardiovascular : Negative for cardiovascular symptoms  Respiratory : Negative for respiratory symptoms  Endocrine: Negative for endocrine symptoms  Musculoskeletal: Negative for musculoskeletal symptoms  Neurological: Headaches  Psychologic: Negative for psychiatric symptoms

## 2020-09-19 DIAGNOSIS — U071 COVID-19: Secondary | ICD-10-CM | POA: Diagnosis not present

## 2020-09-19 DIAGNOSIS — J1282 Pneumonia due to coronavirus disease 2019: Secondary | ICD-10-CM | POA: Diagnosis not present

## 2021-08-25 ENCOUNTER — Ambulatory Visit: Payer: BC Managed Care – PPO | Admitting: Urology

## 2021-08-30 ENCOUNTER — Ambulatory Visit: Payer: BC Managed Care – PPO | Admitting: Urology

## 2021-09-10 ENCOUNTER — Ambulatory Visit: Payer: Self-pay | Admitting: Urology

## 2021-09-22 ENCOUNTER — Other Ambulatory Visit: Payer: Self-pay

## 2021-09-22 ENCOUNTER — Encounter: Payer: Self-pay | Admitting: Urology

## 2021-09-22 ENCOUNTER — Ambulatory Visit (INDEPENDENT_AMBULATORY_CARE_PROVIDER_SITE_OTHER): Payer: BC Managed Care – PPO | Admitting: Urology

## 2021-09-22 VITALS — BP 152/90 | HR 70

## 2021-09-22 DIAGNOSIS — N401 Enlarged prostate with lower urinary tract symptoms: Secondary | ICD-10-CM | POA: Insufficient documentation

## 2021-09-22 DIAGNOSIS — R351 Nocturia: Secondary | ICD-10-CM | POA: Diagnosis not present

## 2021-09-22 DIAGNOSIS — N5201 Erectile dysfunction due to arterial insufficiency: Secondary | ICD-10-CM | POA: Diagnosis not present

## 2021-09-22 LAB — URINALYSIS, ROUTINE W REFLEX MICROSCOPIC
Bilirubin, UA: NEGATIVE
Leukocytes,UA: NEGATIVE
Nitrite, UA: NEGATIVE
RBC, UA: NEGATIVE
Specific Gravity, UA: >1.03 — ABNORMAL HIGH (ref 1.005–1.030)
Urobilinogen, Ur: 0.2 mg/dL (ref 0.2–1.0)
pH, UA: 5.5 (ref 5.0–7.5)

## 2021-09-22 MED ORDER — SILDENAFIL CITRATE 20 MG PO TABS: 20.0000 mg | ORAL_TABLET | ORAL | 3 refills | Status: DC | PRN

## 2021-09-22 NOTE — Progress Notes (Signed)
? ?09/22/2021 ?8:57 AM  ? ?Brent Mercer ?1960/09/02 ?[id number] ? ?Referring provider: Redmond School, MD ?[address] ?[place],  [place] [address] ? ?Followup BPH and erectile dysfunction ? ? ?HPI: ?Mr Brent Mercer is a 61yo here for followup for BPH with nocturia and erectile dysfunction.  IPSS 10 QOL 4. 1 month ago he developed urinary frequency every hour which is large volume. It has a foul smell. UA today shows 1+ glucose. He is thirsty and has dry mouth. 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  He uses sildenafil 20mg  prn for his erectile dysfunction which works well. ? ? ?PMH: ?Past Medical History:  ?Diagnosis Date  ? Headache   ? Hypertension   ? ? ?Surgical History: ?Past Surgical History:  ?Procedure Laterality Date  ? COLONOSCOPY N/A 08/11/2015  ? Procedure: COLONOSCOPY;  Surgeon: Aviva Signs, MD;  [facility];  Service: Gastroenterology;  Laterality: N/A;  ? CYSTOSCOPY WITH INSERTION OF UROLIFT    ? FOOT SURGERY Left   ? HERNIA REPAIR    ? NO PAST SURGERIES    ? PLANTAR FASCIA RELEASE Left 02/12/2020  ? Procedure: OPEN PLANTAR FASCIOTOMY LEFT FOOT;  Surgeon: Caprice Beaver, DPM;  Location: [facility];  Service: Podiatry;  Laterality: Left;  ? ? ?Home Medications:  ?Allergies as of 09/22/2021   ? ?   Reactions  ? Codeine Itching  ? Latex Rash  ? ?  ? ?  ?Medication List  ?  ? ?  ? Accurate as of September 22, 2021  8:57 AM. If you have any questions, ask your nurse or doctor.  ?  ?  ? ?  ? ?atorvastatin 80 MG tablet ?Commonly known as: LIPITOR ?Take 80 mg by mouth daily. ?  ?Bystolic 10 MG tablet ?Generic drug: nebivolol ?Take 10 mg by mouth daily. ?  ?sildenafil 20 MG tablet ?Commonly known as: REVATIO ?Take 1 tablet (20 mg total) by mouth as needed. ?  ? ?  ? ? ?Allergies:  ?Allergies  ?Allergen Reactions  ? Codeine Itching  ? Latex Rash  ? ? ?Family History: ?Family History  ?Problem Relation Age of Onset  ? Colon cancer Other   ? ? ?Social History:  reports that he has never smoked. He has never used  smokeless tobacco. He reports that he does not drink alcohol and does not use drugs. ? ?ROS: ?All other review of systems were reviewed and are negative except what is noted above in HPI ? ?Physical Exam: ?BP (!) 152/90   Pulse 70   ?Constitutional:  Alert and oriented, No acute distress. ?HEENT: [place] AT, moist mucus membranes.  Trachea midline, no masses. ?Cardiovascular: No clubbing, cyanosis, or edema. ?Respiratory: Normal respiratory effort, no increased work of breathing. ?GI: Abdomen is soft, nontender, nondistended, no abdominal masses ?GU: No CVA tenderness.  ?Lymph: No cervical or inguinal lymphadenopathy. ?Skin: No rashes, bruises or suspicious lesions. ?Neurologic: Grossly intact, no focal deficits, moving all 4 extremities. ?Psychiatric: Normal mood and affect. ? ?Laboratory Data: ?Lab Results  ?Component Value Date  ? WBC 5.9 05/22/2020  ? HGB 15.1 05/22/2020  ? HCT 46.1 05/22/2020  ? MCV 93.7 05/22/2020  ? PLT 181 05/22/2020  ? ? ?Lab Results  ?Component Value Date  ? CREATININE 0.94 05/22/2020  ? ? ?No results found for: PSA ? ?No results found for: TESTOSTERONE ? ?Lab Results  ?Component Value Date  ? HGBA1C 6.3 (H) 05/21/2020  ? ? ?Urinalysis ?   ?Component Value Date/Time  ? APPEARANCEUR Clear  09/04/2020 1319  ? GLUCOSEU Negative 09/04/2020 1319  ? BILIRUBINUR Negative 09/04/2020 1319  ? PROTEINUR Negative 09/04/2020 1319  ? NITRITE Negative 09/04/2020 1319  ? LEUKOCYTESUR Negative 09/04/2020 1319  ? ? ?Lab Results  ?Component Value Date  ? LABMICR Comment 09/04/2020  ? ? ?Pertinent Imaging: ? ?No results found for this or any previous visit. ? ?No results found for this or any previous visit. ? ?No results found for this or any previous visit. ? ?No results found for this or any previous visit. ? ?No results found for this or any previous visit. ? ?No results found for this or any previous visit. ? ?No results found for this or any previous visit. ? ?No results found for this or any previous  visit. ? ? ?Assessment & Plan:   ? ?1. Benign localized prostatic hyperplasia with lower urinary tract symptoms (LUTS) ?-patient doing well after Urolift ?- Urinalysis, Routine w reflex microscopic ?- BLADDER SCAN AMB NON-IMAGING ? ?2. Urinary frequency ?-likely related to glycosuria. Patient was instructed to contact Dr. Nolon Rod office for workup fo DMII ? ?2. Erectile dysfunction due to arterial insufficiency ?-continue sildenafil 20mg  prn ? ? ?No follow-ups on file. ? ?Nicolette Bang, MD ? ?[facility] [place] ?  ?

## 2021-09-22 NOTE — Patient Instructions (Signed)
Erectile Dysfunction °Erectile dysfunction (ED) is the inability to get or keep an erection in order to have sexual intercourse. ED is considered a symptom of an underlying disorder and is not considered a disease. ED may include: °Inability to get an erection. °Lack of enough hardness of the erection to allow penetration. °Loss of erection before sex is finished. °What are the causes? °This condition may be caused by: °Physical causes, such as: °Artery problems. This may include heart disease, high blood pressure, atherosclerosis, and diabetes. °Hormonal problems, such as low testosterone. °Obesity. °Nerve problems. This may include back or pelvic injuries, multiple sclerosis, Parkinson's disease, spinal cord injury, and stroke. °Certain medicines, such as: °Pain relievers. °Antidepressants. °Blood pressure medicines and water pills (diuretics). °Cancer medicines. °Antihistamines. °Muscle relaxants. °Lifestyle factors, such as: °Use of drugs such as marijuana, cocaine, or opioids. °Excessive use of alcohol. °Smoking. °Lack of physical activity or exercise. °Psychological causes, such as: °Anxiety or stress. °Sadness or depression. °Exhaustion. °Fear about sexual performance. °Guilt. °What are the signs or symptoms? °Symptoms of this condition include: °Inability to get an erection. °Lack of enough hardness of the erection to allow penetration. °Loss of the erection before sex is finished. °Sometimes having normal erections, but with frequent unsatisfactory episodes. °Low sexual satisfaction in either partner due to erection problems. °A curved penis occurring with erection. The curve may cause pain, or the penis may be too curved to allow for intercourse. °Never having nighttime or morning erections. °How is this diagnosed? °This condition is often diagnosed by: °Performing a physical exam to find other diseases or specific problems with the penis. °Asking you detailed questions about the problem. °Doing tests,  such as: °Blood tests to check for diabetes mellitus or high cholesterol, or to measure hormone levels. °Other tests to check for underlying health conditions. °An ultrasound exam to check for scarring. °A test to check blood flow to the penis. °Doing a sleep study at home to measure nighttime erections. °How is this treated? °This condition may be treated by: °Medicines, such as: °Medicine taken by mouth to help you achieve an erection (oral medicine). °Hormone replacement therapy to replace low testosterone levels. °Medicine that is injected into the penis. Your health care provider may instruct you how to give yourself these injections at home. °Medicine that is delivered with a short applicator tube. The tube is inserted into the opening at the tip of the penis, which is the opening of the urethra. A tiny pellet of medicine is put in the urethra. The pellet dissolves and enhances erectile function. This is also called MUSE (medicated urethral system for erections) therapy. °Vacuum pump. This is a pump with a ring on it. The pump and ring are placed on the penis and used to create pressure that helps the penis become erect. °Penile implant surgery. In this procedure, you may receive: °An inflatable implant. This consists of cylinders, a pump, and a reservoir. The cylinders can be inflated with a fluid that helps to create an erection, and they can be deflated after intercourse. °A semi-rigid implant. This consists of two silicone rubber rods. The rods provide some rigidity. They are also flexible, so the penis can both curve downward in its normal position and become straight for sexual intercourse. °Blood vessel surgery to improve blood flow to the penis. During this procedure, a blood vessel from a different part of the body is placed into the penis to allow blood to flow around (bypass) damaged or blocked blood vessels. °Lifestyle changes,   such as exercising more, losing weight, and quitting smoking. °Follow  these instructions at home: °Medicines ° °Take over-the-counter and prescription medicines only as told by your health care provider. Do not increase the dosage without first discussing it with your health care provider. °If you are using self-injections, do injections as directed by your health care provider. Make sure you avoid any veins that are on the surface of the penis. After giving an injection, apply pressure to the injection site for 5 minutes. °Talk to your health care provider about how to prevent headaches while taking ED medicines. These medicines may cause a sudden headache due to the increase in blood flow in your body. °General instructions °Exercise regularly, as directed by your health care provider. Work with your health care provider to lose weight, if needed. °Do not use any products that contain nicotine or tobacco. These products include cigarettes, chewing tobacco, and vaping devices, such as e-cigarettes. If you need help quitting, ask your health care provider. °Before using a vacuum pump, read the instructions that come with the pump and discuss any questions with your health care provider. °Keep all follow-up visits. This is important. °Contact a health care provider if: °You feel nauseous. °You are vomiting. °You get sudden headaches while taking ED medicines. °You have any concerns about your sexual health. °Get help right away if: °You are taking oral or injectable medicines and you have an erection that lasts longer than 4 hours. If your health care provider is unavailable, go to the nearest emergency room for evaluation. An erection that lasts much longer than 4 hours can result in permanent damage to your penis. °You have severe pain in your groin or abdomen. °You develop redness or severe swelling of your penis. °You have redness spreading at your groin or lower abdomen. °You are unable to urinate. °You experience chest pain or a rapid heartbeat (palpitations) after taking oral  medicines. °These symptoms may represent a serious problem that is an emergency. Do not wait to see if the symptoms will go away. Get medical help right away. Call your local emergency services (911 in the [place] Do not drive yourself to the hospital. °Summary °Erectile dysfunction (ED) is the inability to get or keep an erection during sexual intercourse. °This condition is diagnosed based on a physical exam, your symptoms, and tests to determine the cause. Treatment varies depending on the cause and may include medicines, hormone therapy, surgery, or a vacuum pump. °You may need follow-up visits to make sure that you are using your medicines or devices correctly. °Get help right away if you are taking or injecting medicines and you have an erection that lasts longer than 4 hours. °This information is not intended to replace advice given to you by your health care provider. Make sure you discuss any questions you have with your health care provider. °Document Revised: 09/23/2020 Document Reviewed: 09/23/2020 °Elsevier Patient Education © 2022 [employer] ° °

## 2021-09-22 NOTE — Progress Notes (Signed)
post void residual=12 

## 2021-10-05 DIAGNOSIS — N400 Enlarged prostate without lower urinary tract symptoms: Secondary | ICD-10-CM | POA: Diagnosis not present

## 2021-10-05 DIAGNOSIS — Z0001 Encounter for general adult medical examination with abnormal findings: Secondary | ICD-10-CM | POA: Diagnosis not present

## 2021-10-05 DIAGNOSIS — E119 Type 2 diabetes mellitus without complications: Secondary | ICD-10-CM | POA: Diagnosis not present

## 2021-11-11 ENCOUNTER — Ambulatory Visit: Payer: BC Managed Care – PPO | Admitting: Cardiology

## 2021-12-13 ENCOUNTER — Encounter: Payer: Self-pay | Admitting: Cardiology

## 2021-12-15 ENCOUNTER — Ambulatory Visit (INDEPENDENT_AMBULATORY_CARE_PROVIDER_SITE_OTHER): Payer: BC Managed Care – PPO | Admitting: Cardiology

## 2021-12-15 ENCOUNTER — Other Ambulatory Visit: Payer: Self-pay | Admitting: Cardiology

## 2021-12-15 ENCOUNTER — Ambulatory Visit (INDEPENDENT_AMBULATORY_CARE_PROVIDER_SITE_OTHER): Payer: BC Managed Care – PPO

## 2021-12-15 ENCOUNTER — Encounter: Payer: Self-pay | Admitting: Cardiology

## 2021-12-15 VITALS — BP 122/88 | HR 74 | Ht 71.0 in | Wt 195.8 lb

## 2021-12-15 DIAGNOSIS — R002 Palpitations: Secondary | ICD-10-CM

## 2021-12-15 DIAGNOSIS — E1165 Type 2 diabetes mellitus with hyperglycemia: Secondary | ICD-10-CM

## 2021-12-15 DIAGNOSIS — E782 Mixed hyperlipidemia: Secondary | ICD-10-CM | POA: Diagnosis not present

## 2021-12-15 DIAGNOSIS — Z8673 Personal history of transient ischemic attack (TIA), and cerebral infarction without residual deficits: Secondary | ICD-10-CM

## 2021-12-15 DIAGNOSIS — I10 Essential (primary) hypertension: Secondary | ICD-10-CM

## 2021-12-15 NOTE — Patient Instructions (Addendum)
Medication Instructions:  Your physician recommends that you continue on your current medications as directed. Please refer to the Current Medication list given to you today.  Labwork: none  Testing/Procedures: Your physician has requested that you have an echocardiogram. Echocardiography is a painless test that uses sound waves to create images of your heart. It provides your doctor with information about the size and shape of your heart and how well your heart's chambers and valves are working. This procedure takes approximately one hour. There are no restrictions for this procedure. Your physician has recommended that you wear a Zio monitor.   This monitor is a medical device that records the heart's electrical activity. Doctors most often use these monitors to diagnose arrhythmias. Arrhythmias are problems with the speed or rhythm of the heartbeat. The monitor is a small device applied to your chest. You can wear one while you do your normal daily activities. While wearing this monitor if you have any symptoms to push the button and record what you felt. Once you have worn this monitor for the period of time provider prescribed (for 14 days), you will return the monitor device in the postage paid box. Once it is returned they will download the data collected and provide us with a report which the provider will then review and we will call you with those results. Important tips:  Avoid showering during the first 24 hours of wearing the monitor. Avoid excessive sweating to help maximize wear time. Do not submerge the device, no hot tubs, and no swimming pools. Keep any lotions or oils away from the patch. After 24 hours you may shower with the patch on. Take brief showers with your back facing the shower head.  Do not remove patch once it has been placed because that will interrupt data and decrease adhesive wear time. Push the button when you have any symptoms and write down what you were  feeling. Once you have completed wearing your monitor, remove and place into box which has postage paid and place in your outgoing mailbox.  If for some reason you have misplaced your box then call our office and we can provide another box and/or mail it off for you.  Follow-Up: Your physician recommends that you schedule a follow-up appointment in: pending  Any Other Special Instructions Will Be Listed Below (If Applicable).  If you need a refill on your cardiac medications before your next appointment, please call your pharmacy. 

## 2021-12-15 NOTE — Progress Notes (Signed)
Cardiology Office Note  Date: 12/15/2021   ID: Brent Mercer, DOB 1960/11/13, MRN [id number]  PCP:  Brent Nevins, MD  Cardiologist:  Nona Dell, MD Electrophysiologist:  None   Chief Complaint  Patient presents with   History of stroke    History of Present Illness: Brent Mercer is a 61 y.o. male referred for cardiology consultation by Dr. Sherwood Gambler with history of stroke per discussion with the patient and based on chart review.  He was seen at [facility] in [place] back in late October of last year with left arm and leg weakness that developed at rest.  It looks like he was observed in an urgent care facility for about 12 hours, not admitted.  And a window for tPA.  Head CT showed no acute findings, but subsequent brain MRI indicated several small punctate foci within the right posterior frontal precentral sulcus consistent with infarcts, no acute hemorrhage or mass effect.  He had no evidence of obstructive carotid artery disease based on CTA.  ECG reported that he was in sinus rhythm at the time.  It was recommended that he start on statin therapy and also a baby aspirin.  His LDL was 164 at that point.  He tells me that the left arm and leg weakness have completely resolved, it took him nearly 2 months for his left leg weakness to resolve.  He did not undergo physical therapy.  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  He does not report any recent palpitations, no known history of cardiac arrhythmia.  States that he wore a cardiac monitor back in his 27s which apparently did not show any thing significant.  He has not had a recent echocardiogram.  I personally reviewed his ECG today which shows normal sinus rhythm.  Past Medical History:  Diagnosis Date   Essential hypertension    Headache    History of stroke    November 2022 - [place]   Mixed hyperlipidemia    Type 2 diabetes mellitus [facility])     Past Surgical  History:  Procedure Laterality Date   COLONOSCOPY N/A 08/11/2015   Procedure: COLONOSCOPY;  Surgeon: Franky Macho, MD;  Location: [facility];  Service: Gastroenterology;  Laterality: N/A;   CYSTOSCOPY WITH INSERTION OF UROLIFT     FOOT SURGERY Left    HERNIA REPAIR     PLANTAR FASCIA RELEASE Left 02/12/2020   Procedure: OPEN PLANTAR FASCIOTOMY LEFT FOOT;  Surgeon: Ferman Hamming, DPM;  Location: [facility] ORS;  Service: Podiatry;  Laterality: Left;    Current Outpatient Medications  Medication Sig Dispense Refill   atorvastatin (LIPITOR) 80 MG tablet Take 80 mg by mouth daily.     BYSTOLIC 10 MG tablet Take 10 mg by mouth daily.      metFORMIN (GLUCOPHAGE) 500 MG tablet Take 500 mg by mouth 2 (two) times daily.     sildenafil (REVATIO) 20 MG tablet Take 1 tablet (20 mg total) by mouth as needed. 90 tablet 3   No current facility-administered medications for this visit.   Allergies:  Codeine and Latex   Social History: The patient  reports that he has never smoked. He has never used smokeless tobacco. He reports that he does not drink alcohol and does not use drugs.   Family History: The patient's family history includes Colon cancer in an other family member; Diabetes in his brother, maternal grandfather, and mother; Heart disease in his mother; Hypertension in his  maternal grandfather and mother.   ROS: Erectile dysfunction.  Physical Exam: VS:  BP 122/88   Pulse 74   Ht 5\' 11"  (1.803 m)   Wt 195 lb 12.8 oz (88.8 kg)   SpO2 97%   BMI 27.31 kg/m , BMI Body mass index is 27.31 kg/m.  Wt Readings from Last 3 Encounters:  12/15/21 195 lb 12.8 oz (88.8 kg)  09/04/20 189 lb (85.7 kg)  05/20/20 192 lb (87.1 kg)    General: Patient appears comfortable at rest. HEENT: Conjunctiva and lids normal, wearing a mask. Neck: Supple, no elevated JVP or carotid bruits, no thyromegaly. Lungs: Clear to auscultation, nonlabored breathing at rest. Cardiac: Regular rate and rhythm, no S3 or  significant systolic murmur, no pericardial rub. Abdomen: Soft, nontender, bowel sounds present. Extremities: No pitting edema, distal pulses 2+. Skin: Warm and dry. Musculoskeletal: No kyphosis. Neuropsychiatric: Alert and oriented x3, affect grossly appropriate.  ECG:  An ECG dated 05/20/2020 was personally reviewed today and demonstrated:  Sinus rhythm.  Recent Labwork:  October 2022: Hgb 16.0, platelets 164, cholesterol 231, TG 153, HDL 36, LDL 164, potassium 4.0, BUN 9, creatinine 0.84, AST 26, ALT 24, TSH 6.16 September 2021: Hemoglobin A1c 8.6%  Other Studies Reviewed Today:  Head and neck CTA 05/09/2021: Findings:   There are mild calcifications / < 25% stenosis in the proximal RIGHT  internal carotid artery  - otherwise there is relatively normal flow  identified within the remainder of the aortic arch, great vessels,  bilateral common carotid, and left internal carotid arteries without  significant stenosis, vascular malformation nor dissection.    The 3-D volumetric rotational MIPS images show similar findings to  the axial images.    There is slight tortuosity and angularity of the cervical segment  internal carotid arteries .    There is relatively normal flow in a dominant left vertebral artery  ,  and a diffusely tiny / small right vertebral artery , with  suspicion of segmental occlusions noted distally.    There is a patent basilar artery.    There is relatively normal flow identified within the distal  internal carotid arteries, anterior and middle cerebral arteries  without acute significant stenosis, vascular  malformation-intracranial aneurysm nor dissection.    There is relatively normal flow in the posterior cerebral arteries -  without significant stenosis, vascular malformation-intracranial  aneurysm nor dissection.    The thyroid gland is unremarkable.    There are degenerative changes of the cervical spine .    The visualized lungs are  unremarkable.    Note is made of prominence of the adipose tissues identified in the  subcutaneous structures of the upper back - posterior neck  .    The images are slightly compromised secondary to overlying bone  artifacts .  Head CT 05/09/2021: IMPRESSION:  1.  There is NO acute intracerebral hemorrhage, intraparenchymal  masses, nor abnormal acute extra-axial fluid collections .   2. There are other minor chronic findings as detailed above. Clinical  correlation is advised.   3.  Note that if there is STRONG clinical suspicion to evaluate for:  acute ischemia, mass lesions, hemorrhage, parenchymal signal  alteration - a contrast-enhanced WITHOUT AND WITH Brain MRI will be  of further benefit.  Brain MRI 05/10/2021: IMPRESSION:  1. There are several small punctate foci restricted diffusion in the  right posterior frontal precentral sulcus, as seen on diffusion image  #[id number]. These findings are in keeping with small developing  infarctions.  (See arrows).   No abnormal enhancement of brain parenchyma.   No acute intracranial hemorrhage, mass or mass effect.  Assessment and Plan:  1.  History of stroke in October 2022 as noted above.  No obstructive internal carotid artery stenosis by CTA at that time and reportedly in sinus rhythm by ECG.  He is now on aspirin 81 mg daily as well as high-dose Lipitor, tolerating both.  Symptoms have completely resolved, previously with left-sided hemiparesis.  Recommend 14-day Zio patch and also an echocardiogram which we will arrange.  No change to current regimen for now, although I did recommend that he follow-up with Dr. Sherwood GamblerFusco to have his lipids repeated and make sure his LDL is closer to goal.  2.  Mixed hyperlipidemia, LDL 164 in October 2022.  He has been on Lipitor 80 mg daily since that time.  3.  Recently diagnosed type 2 diabetes mellitus with hemoglobin A1c 8.6%.  He is being managed by Dr. Sherwood GamblerFusco.  Currently on Glucophage.  4.   Hypertension, on Bystolic.  Blood pressure control is adequate today.  Medication Adjustments/Labs and Tests Ordered: Current medicines are reviewed at length with the patient today.  Concerns regarding medicines are outlined above.   Tests Ordered: Orders Placed This Encounter  Procedures   EKG 12-Lead    Medication Changes: No orders of the defined types were placed in this encounter.   Disposition:  Follow up  test results.  Signed, Jonelle SidleSamuel G. Shella Lahman, MD, [facility] HospitalFACC 12/15/2021 3:11 PM    [facility] [address], [place], [place] [address] Phone: [phone]; Fax: [phone]

## 2021-12-16 ENCOUNTER — Other Ambulatory Visit: Payer: Self-pay | Admitting: Cardiology

## 2021-12-16 DIAGNOSIS — I499 Cardiac arrhythmia, unspecified: Secondary | ICD-10-CM

## 2021-12-16 DIAGNOSIS — I639 Cerebral infarction, unspecified: Secondary | ICD-10-CM

## 2021-12-18 IMAGING — DX DG CHEST 1V PORT
1 series · 1 of 1 positions shown · non-contrast
Comparison: None.

CLINICAL DATA: Shortness of breath.  [id number] positive

EXAM:
PORTABLE CHEST 1 VIEW

[chest ap]
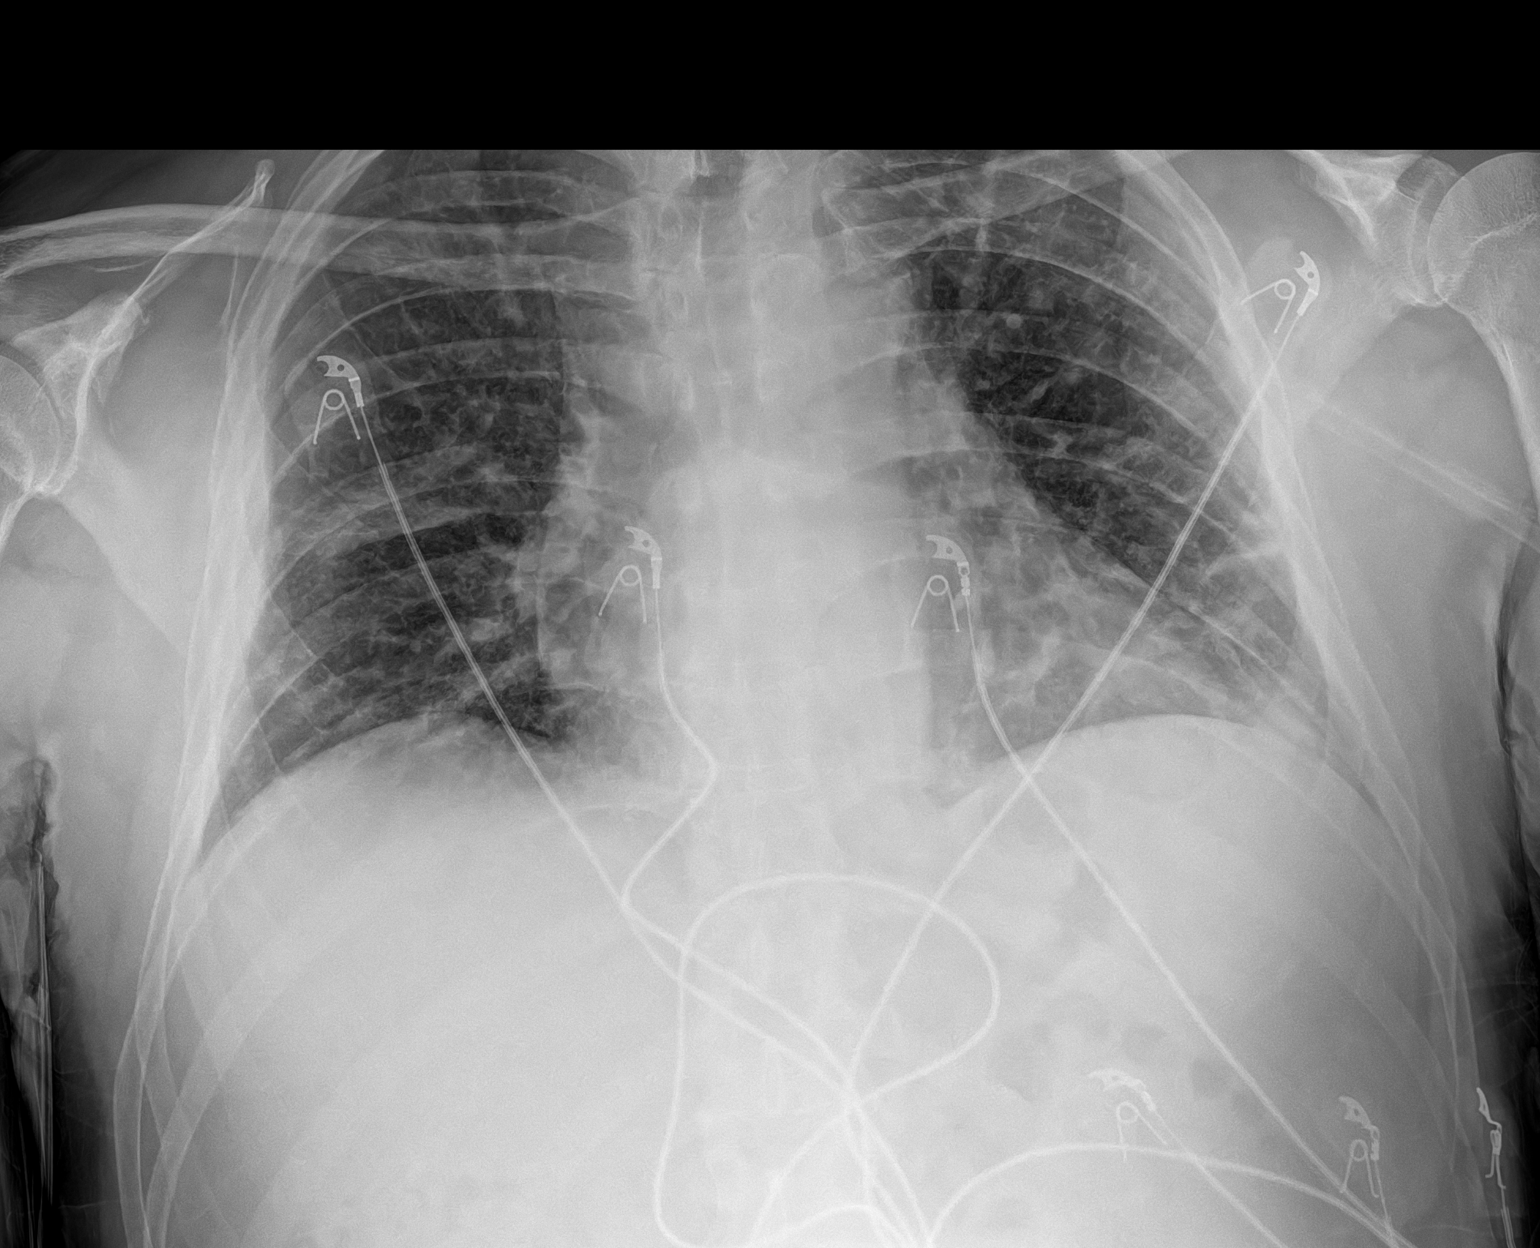

[1 of 1 positions shown; findings below may reference images not displayed]

FINDINGS: Patchy airspace opacity is noted in each mid and lower lung region.
No consolidation. Lungs elsewhere clear. Heart is upper normal in
size with pulmonary vascularity normal. No adenopathy. No bone
lesions.
IMPRESSION: Patchy airspace opacity bilaterally, likely indicative of multifocal
atypical organism pneumonia. No consolidation. Heart upper normal in
size. No adenopathy evident.

## 2021-12-30 DIAGNOSIS — L259 Unspecified contact dermatitis, unspecified cause: Secondary | ICD-10-CM | POA: Diagnosis not present

## 2021-12-30 DIAGNOSIS — D225 Melanocytic nevi of trunk: Secondary | ICD-10-CM | POA: Diagnosis not present

## 2021-12-30 DIAGNOSIS — L720 Epidermal cyst: Secondary | ICD-10-CM | POA: Diagnosis not present

## 2021-12-30 DIAGNOSIS — Z1289 Encounter for screening for malignant neoplasm of other sites: Secondary | ICD-10-CM | POA: Diagnosis not present

## 2022-01-03 ENCOUNTER — Ambulatory Visit (INDEPENDENT_AMBULATORY_CARE_PROVIDER_SITE_OTHER): Payer: BC Managed Care – PPO

## 2022-01-03 DIAGNOSIS — I639 Cerebral infarction, unspecified: Secondary | ICD-10-CM

## 2022-01-03 DIAGNOSIS — I6389 Other cerebral infarction: Secondary | ICD-10-CM | POA: Diagnosis not present

## 2022-01-03 DIAGNOSIS — I499 Cardiac arrhythmia, unspecified: Secondary | ICD-10-CM

## 2022-01-03 LAB — ECHOCARDIOGRAM COMPLETE
AV Vena cont: 0.25 cm
Area-P 1/2: 3.54 cm2
Calc EF: 63.6 %
P 1/2 time: 2719 msec
S' Lateral: 2.11 cm
Single Plane A2C EF: 63.4 %
Single Plane A4C EF: 63.1 %

## 2022-01-05 ENCOUNTER — Telehealth: Payer: Self-pay | Admitting: Cardiology

## 2022-01-05 NOTE — Telephone Encounter (Signed)
-----   Message from Jonelle Sidle, MD sent at 01/05/2022 10:49 AM EDT ----- Cardiac monitor did not demonstrate any atrial fibrillation which was the main question in light of his history of stroke.  He did have several episodes of brief PSVT, and is already on Bystolic.  In light of monitor and recent echo results, would plan to continue current medical regimen and he should keep follow-up with Dr. Sherwood Gambler. ----- Message ----- From: Eustace Moore, RN Sent: 01/05/2022  10:28 AM EDT To: Jonelle Sidle, MD  Any additional result note to be added?  ----- Message ----- From: Jonelle Sidle, MD Sent: 01/05/2022  10:24 AM EDT To: Eustace Moore, RN  Results reviewed.

## 2022-01-05 NOTE — Telephone Encounter (Signed)
Follow U:    Patient is returning Lydia's call from yesterday, concerning his results.

## 2022-01-05 NOTE — Telephone Encounter (Signed)
Mychart message sent. Copy sent to PCP 

## 2022-01-05 NOTE — Telephone Encounter (Signed)
-----   Message from Jonelle Sidle, MD sent at 01/03/2022  3:47 PM EDT ----- Results reviewed.  Echocardiogram shows normal LVEF at 60 to 65%, no substantial abnormalities to be associated with prior history of stroke.  Follow-up on heart monitor when results are back.

## 2022-01-07 ENCOUNTER — Telehealth: Payer: Self-pay | Admitting: Cardiology

## 2022-01-07 NOTE — Telephone Encounter (Signed)
Patient is returning phone call. Please call back 

## 2022-01-10 NOTE — Telephone Encounter (Signed)
Patient notified and verbalized understanding.  Copy to pcp.   

## 2022-01-10 NOTE — Telephone Encounter (Signed)
Heart monitor result- Cardiac monitor did not demonstrate any atrial fibrillation which was the main question in light of his history of stroke.  He did have several episodes of brief PSVT (faster heart rate), and is already on Bystolic.  In light of monitor and recent echo results, would plan to continue current medical regimen and he should keep follow-up with Dr. Sherwood Gambler.     Echocardiogram result- Results reviewed.  Echocardiogram shows normal LVEF (heart pumping function) at 60 to 65%, no substantial abnormalities to be associated with prior history of stroke.

## 2022-09-21 ENCOUNTER — Ambulatory Visit (INDEPENDENT_AMBULATORY_CARE_PROVIDER_SITE_OTHER): Payer: BC Managed Care – PPO | Admitting: Urology

## 2022-09-21 VITALS — BP 144/88 | HR 71

## 2022-09-21 DIAGNOSIS — R351 Nocturia: Secondary | ICD-10-CM

## 2022-09-21 DIAGNOSIS — N5201 Erectile dysfunction due to arterial insufficiency: Secondary | ICD-10-CM

## 2022-09-21 DIAGNOSIS — N401 Enlarged prostate with lower urinary tract symptoms: Secondary | ICD-10-CM

## 2022-09-21 MED ORDER — SILDENAFIL CITRATE 20 MG PO TABS: 20.0000 mg | ORAL_TABLET | ORAL | 3 refills | Status: DC | PRN

## 2022-09-21 NOTE — Patient Instructions (Signed)
Erectile Dysfunction ?Erectile dysfunction (ED) is the inability to get or keep an erection in order to have sexual intercourse. ED is considered a symptom of an underlying disorder and is not considered a disease. ED may include: ?Inability to get an erection. ?Lack of enough hardness of the erection to allow penetration. ?Loss of erection before sex is finished. ?What are the causes? ?This condition may be caused by: ?Physical causes, such as: ?Artery problems. This may include heart disease, high blood pressure, atherosclerosis, and diabetes. ?Hormonal problems, such as low testosterone. ?Obesity. ?Nerve problems. This may include back or pelvic injuries, multiple sclerosis, Parkinson's disease, spinal cord injury, and stroke. ?Certain medicines, such as: ?Pain relievers. ?Antidepressants. ?Blood pressure medicines and water pills (diuretics). ?Cancer medicines. ?Antihistamines. ?Muscle relaxants. ?Lifestyle factors, such as: ?Use of drugs such as marijuana, cocaine, or opioids. ?Excessive use of alcohol. ?Smoking. ?Lack of physical activity or exercise. ?Psychological causes, such as: ?Anxiety or stress. ?Sadness or depression. ?Exhaustion. ?Fear about sexual performance. ?Guilt. ?What are the signs or symptoms? ?Symptoms of this condition include: ?Inability to get an erection. ?Lack of enough hardness of the erection to allow penetration. ?Loss of the erection before sex is finished. ?Sometimes having normal erections, but with frequent unsatisfactory episodes. ?Low sexual satisfaction in either partner due to erection problems. ?A curved penis occurring with erection. The curve may cause pain, or the penis may be too curved to allow for intercourse. ?Never having nighttime or morning erections. ?How is this diagnosed? ?This condition is often diagnosed by: ?Performing a physical exam to find other diseases or specific problems with the penis. ?Asking you detailed questions about the problem. ?Doing tests,  such as: ?Blood tests to check for diabetes mellitus or high cholesterol, or to measure hormone levels. ?Other tests to check for underlying health conditions. ?An ultrasound exam to check for scarring. ?A test to check blood flow to the penis. ?Doing a sleep study at home to measure nighttime erections. ?How is this treated? ?This condition may be treated by: ?Medicines, such as: ?Medicine taken by mouth to help you achieve an erection (oral medicine). ?Hormone replacement therapy to replace low testosterone levels. ?Medicine that is injected into the penis. Your health care provider may instruct you how to give yourself these injections at home. ?Medicine that is delivered with a short applicator tube. The tube is inserted into the opening at the tip of the penis, which is the opening of the urethra. A tiny pellet of medicine is put in the urethra. The pellet dissolves and enhances erectile function. This is also called MUSE (medicated urethral system for erections) therapy. ?Vacuum pump. This is a pump with a ring on it. The pump and ring are placed on the penis and used to create pressure that helps the penis become erect. ?Penile implant surgery. In this procedure, you may receive: ?An inflatable implant. This consists of cylinders, a pump, and a reservoir. The cylinders can be inflated with a fluid that helps to create an erection, and they can be deflated after intercourse. ?A semi-rigid implant. This consists of two silicone rubber rods. The rods provide some rigidity. They are also flexible, so the penis can both curve downward in its normal position and become straight for sexual intercourse. ?Blood vessel surgery to improve blood flow to the penis. During this procedure, a blood vessel from a different part of the body is placed into the penis to allow blood to flow around (bypass) damaged or blocked blood vessels. ?Lifestyle changes,   such as exercising more, losing weight, and quitting smoking. ?Follow  these instructions at home: ?Medicines ? ?Take over-the-counter and prescription medicines only as told by your health care provider. Do not increase the dosage without first discussing it with your health care provider. ?If you are using self-injections, do injections as directed by your health care provider. Make sure you avoid any veins that are on the surface of the penis. After giving an injection, apply pressure to the injection site for 5 minutes. ?Talk to your health care provider about how to prevent headaches while taking ED medicines. These medicines may cause a sudden headache due to the increase in blood flow in your body. ?General instructions ?Exercise regularly, as directed by your health care provider. Work with your health care provider to lose weight, if needed. ?Do not use any products that contain nicotine or tobacco. These products include cigarettes, chewing tobacco, and vaping devices, such as e-cigarettes. If you need help quitting, ask your health care provider. ?Before using a vacuum pump, read the instructions that come with the pump and discuss any questions with your health care provider. ?Keep all follow-up visits. This is important. ?Contact a health care provider if: ?You feel nauseous. ?You are vomiting. ?You get sudden headaches while taking ED medicines. ?You have any concerns about your sexual health. ?Get help right away if: ?You are taking oral or injectable medicines and you have an erection that lasts longer than 4 hours. If your health care provider is unavailable, go to the nearest emergency room for evaluation. An erection that lasts much longer than 4 hours can result in permanent damage to your penis. ?You have severe pain in your groin or abdomen. ?You develop redness or severe swelling of your penis. ?You have redness spreading at your groin or lower abdomen. ?You are unable to urinate. ?You experience chest pain or a rapid heartbeat (palpitations) after taking oral  medicines. ?These symptoms may represent a serious problem that is an emergency. Do not wait to see if the symptoms will go away. Get medical help right away. Call your local emergency services (911 in the [place] Do not drive yourself to the hospital. ?Summary ?Erectile dysfunction (ED) is the inability to get or keep an erection during sexual intercourse. ?This condition is diagnosed based on a physical exam, your symptoms, and tests to determine the cause. Treatment varies depending on the cause and may include medicines, hormone therapy, surgery, or a vacuum pump. ?You may need follow-up visits to make sure that you are using your medicines or devices correctly. ?Get help right away if you are taking or injecting medicines and you have an erection that lasts longer than 4 hours. ?This information is not intended to replace advice given to you by your health care provider. Make sure you discuss any questions you have with your health care provider. ?Document Revised: 09/23/2020 Document Reviewed: 09/23/2020 ?Elsevier Patient Education ? 2023 [employer] Inc[employer] ? ?

## 2022-09-21 NOTE — Progress Notes (Signed)
09/21/2022 3:57 PM   Jennette Kettle 1960-12-17 [id number]  Referring provider: Redmond School, MD [address] [place],  [place] [address]  Followup erectile dysfunction and nocturia   HPI: Mr Reish is a 62yo here for followup for erectile dysfunction and nocturia. IPSS 1 QOL 1 on no BPh therapy. He is doing well after Urolift. He is now on metformin. He uses sildenafil 20mg  prn with good results.    PMH: Past Medical History:  Diagnosis Date   Essential hypertension    Headache    History of stroke    November 2022 - [place]   Mixed hyperlipidemia    Type 2 diabetes mellitus [facility])     Surgical History: Past Surgical History:  Procedure Laterality Date   COLONOSCOPY N/A 08/11/2015   Procedure: COLONOSCOPY;  Surgeon: Aviva Signs, MD;  [facility];  Service: Gastroenterology;  Laterality: N/A;   CYSTOSCOPY WITH INSERTION OF UROLIFT     FOOT SURGERY Left    HERNIA REPAIR     PLANTAR FASCIA RELEASE Left 02/12/2020   Procedure: OPEN PLANTAR FASCIOTOMY LEFT FOOT;  Surgeon: Caprice Beaver, DPM;  Location: [facility];  Service: Podiatry;  Laterality: Left;    Home Medications:  Allergies as of 09/21/2022       Reactions   Codeine Itching   Latex Rash        Medication List        Accurate as of September 21, 2022  3:57 PM. If you have any questions, ask your nurse or doctor.          atorvastatin 80 MG tablet Commonly known as: LIPITOR Take 80 mg by mouth daily.   Bystolic 10 MG tablet Generic drug: nebivolol Take 10 mg by mouth daily.   metFORMIN 500 MG tablet Commonly known as: GLUCOPHAGE Take 500 mg by mouth 2 (two) times daily.   sildenafil 20 MG tablet Commonly known as: REVATIO Take 1 tablet (20 mg total) by mouth as needed.        Allergies:  Allergies  Allergen Reactions   Codeine Itching   Latex Rash    Family History: Family History  Problem Relation Age of Onset   Hypertension Mother    Diabetes Mother     Heart disease Mother    Diabetes Brother    Hypertension Maternal Grandfather    Diabetes Maternal Grandfather    Colon cancer Other     Social History:  reports that he has never smoked. He has never used smokeless tobacco. He reports that he does not drink alcohol and does not use drugs.  ROS: All other review of systems were reviewed and are negative except what is noted above in HPI  Physical Exam: BP (!) 144/88   Pulse 71   Constitutional:  Alert and oriented, No acute distress. HEENT:  AT, moist mucus membranes.  Trachea midline, no masses. Cardiovascular: No clubbing, cyanosis, or edema. Respiratory: Normal respiratory effort, no increased work of breathing. GI: Abdomen is soft, nontender, nondistended, no abdominal masses GU: No CVA tenderness.  Lymph: No cervical or inguinal lymphadenopathy. Skin: No rashes, bruises or suspicious lesions. Neurologic: Grossly intact, no focal deficits, moving all 4 extremities. Psychiatric: Normal mood and affect.  Laboratory Data: Lab Results  Component Value Date   WBC 5.9 05/22/2020   HGB 15.1 05/22/2020   HCT 46.1 05/22/2020   MCV 93.7 05/22/2020   PLT 181 05/22/2020    Lab Results  Component Value Date   CREATININE  0.94 05/22/2020    No results found for: "PSA"  No results found for: "TESTOSTERONE"  Lab Results  Component Value Date   HGBA1C 6.3 (H) 05/21/2020    Urinalysis    Component Value Date/Time   APPEARANCEUR Clear 09/22/2021 1103   GLUCOSEU Trace (A) 09/22/2021 1103   BILIRUBINUR Negative 09/22/2021 1103   PROTEINUR Trace (A) 09/22/2021 1103   NITRITE Negative 09/22/2021 1103   LEUKOCYTESUR Negative 09/22/2021 1103    Lab Results  Component Value Date   LABMICR Comment 09/22/2021    Pertinent Imaging:  No results found for this or any previous visit.  No results found for this or any previous visit.  No results found for this or any previous visit.  No results found for this or any  previous visit.  No results found for this or any previous visit.  No valid procedures specified. No results found for this or any previous visit.  No results found for this or any previous visit.   Assessment & Plan:    1. Benign localized prostatic hyperplasia with lower urinary tract symptoms (LUTS) -improved after urolift - Urinalysis, Routine w reflex microscopic  2. Nocturia Decrease fluid intake within 2 hours of going to bed  3. Erectile dysfunction due to arterial insufficiency -continue sildenafil 20mg  prn   No follow-ups on file.  Nicolette Bang, MD  [facility] [place]

## 2022-09-22 LAB — URINALYSIS, ROUTINE W REFLEX MICROSCOPIC
Bilirubin, UA: NEGATIVE
Ketones, UA: NEGATIVE
Leukocytes,UA: NEGATIVE
Nitrite, UA: NEGATIVE
Protein,UA: NEGATIVE
RBC, UA: NEGATIVE
Specific Gravity, UA: 1.02 (ref 1.005–1.030)
Urobilinogen, Ur: 0.2 mg/dL (ref 0.2–1.0)
pH, UA: 6 (ref 5.0–7.5)

## 2022-09-23 ENCOUNTER — Encounter: Payer: Self-pay | Admitting: Urology

## 2022-11-07 DIAGNOSIS — R5383 Other fatigue: Secondary | ICD-10-CM | POA: Diagnosis not present

## 2022-11-07 DIAGNOSIS — Z0001 Encounter for general adult medical examination with abnormal findings: Secondary | ICD-10-CM | POA: Diagnosis not present

## 2022-11-07 DIAGNOSIS — N400 Enlarged prostate without lower urinary tract symptoms: Secondary | ICD-10-CM | POA: Diagnosis not present

## 2022-11-18 ENCOUNTER — Other Ambulatory Visit: Payer: Self-pay | Admitting: Urology

## 2023-03-08 DIAGNOSIS — E119 Type 2 diabetes mellitus without complications: Secondary | ICD-10-CM | POA: Diagnosis not present

## 2023-03-08 DIAGNOSIS — N400 Enlarged prostate without lower urinary tract symptoms: Secondary | ICD-10-CM | POA: Diagnosis not present

## 2023-03-08 DIAGNOSIS — Z0001 Encounter for general adult medical examination with abnormal findings: Secondary | ICD-10-CM | POA: Diagnosis not present

## 2023-07-21 DIAGNOSIS — J189 Pneumonia, unspecified organism: Secondary | ICD-10-CM | POA: Diagnosis not present

## 2023-07-21 DIAGNOSIS — J019 Acute sinusitis, unspecified: Secondary | ICD-10-CM | POA: Diagnosis not present

## 2023-07-21 DIAGNOSIS — J069 Acute upper respiratory infection, unspecified: Secondary | ICD-10-CM | POA: Diagnosis not present

## 2023-07-27 DIAGNOSIS — Z6826 Body mass index (BMI) 26.0-26.9, adult: Secondary | ICD-10-CM | POA: Diagnosis not present

## 2023-07-27 DIAGNOSIS — R059 Cough, unspecified: Secondary | ICD-10-CM | POA: Diagnosis not present

## 2023-07-27 DIAGNOSIS — J22 Unspecified acute lower respiratory infection: Secondary | ICD-10-CM | POA: Diagnosis not present

## 2023-07-27 DIAGNOSIS — I10 Essential (primary) hypertension: Secondary | ICD-10-CM | POA: Diagnosis not present

## 2023-09-18 ENCOUNTER — Encounter: Payer: Self-pay | Admitting: Urology

## 2023-09-18 ENCOUNTER — Ambulatory Visit: Payer: BC Managed Care – PPO | Admitting: Urology

## 2023-09-18 VITALS — BP 143/88 | HR 76

## 2023-09-18 DIAGNOSIS — N5201 Erectile dysfunction due to arterial insufficiency: Secondary | ICD-10-CM

## 2023-09-18 DIAGNOSIS — R351 Nocturia: Secondary | ICD-10-CM | POA: Diagnosis not present

## 2023-09-18 DIAGNOSIS — N401 Enlarged prostate with lower urinary tract symptoms: Secondary | ICD-10-CM | POA: Diagnosis not present

## 2023-09-18 MED ORDER — SILDENAFIL CITRATE 20 MG PO TABS: 20.0000 mg | ORAL_TABLET | ORAL | 1 refills | Status: DC | PRN

## 2023-09-18 NOTE — Progress Notes (Unsigned)
 09/18/2023 2:05 PM   Brent Mercer [birth year] [id number]  Referring provider: Elfredia Nevins, MD [address] [place],  [place] [address]  Followup erectile dysfunction   HPI: Brent Mercer is a 63yo here for followup for BPH with nocturia and erectile dysfunction. IPSS 5 QOL 2 after Urolift. Nocturia 0-1x. Urine stream is strong. No straining to urinate. He uses sildenafil 20mg  prn with good results    PMH: Past Medical History:  Diagnosis Date   Essential hypertension    Headache    History of stroke    November 2022 - [place]   Mixed hyperlipidemia    Type 2 diabetes mellitus [facility])     Surgical History: Past Surgical History:  Procedure Laterality Date   COLONOSCOPY N/A 08/11/2015   Procedure: COLONOSCOPY;  Surgeon: Franky Macho, MD;  [facility];  Service: Gastroenterology;  Laterality: N/A;   CYSTOSCOPY WITH INSERTION OF UROLIFT     FOOT SURGERY Left    HERNIA REPAIR     PLANTAR FASCIA RELEASE Left 02/12/2020   Procedure: OPEN PLANTAR FASCIOTOMY LEFT FOOT;  Surgeon: Ferman Hamming, DPM;  Location: [facility];  Service: Podiatry;  Laterality: Left;    Home Medications:  Allergies as of 09/18/2023       Reactions   Codeine Itching   Latex Rash        Medication List        Accurate as of September 18, 2023  2:05 PM. If you have any questions, ask your nurse or doctor.          atorvastatin 80 MG tablet Commonly known as: LIPITOR Take 80 mg by mouth daily.   Bystolic 10 MG tablet Generic drug: nebivolol Take 10 mg by mouth daily.   metFORMIN 500 MG tablet Commonly known as: GLUCOPHAGE Take 500 mg by mouth 2 (two) times daily.   sildenafil 20 MG tablet Commonly known as: REVATIO TAKE 1 TABLET BY MOUTH AS NEEDED        Allergies:  Allergies  Allergen Reactions   Codeine Itching   Latex Rash    Family History: Family History  Problem Relation Age of Onset   Hypertension Mother    Diabetes Mother    Heart disease  Mother    Diabetes Brother    Hypertension Maternal Grandfather    Diabetes Maternal Grandfather    Colon cancer Other     Social History:  reports that he has never smoked. He has never used smokeless tobacco. He reports that he does not drink alcohol and does not use drugs.  ROS: All other review of systems were reviewed and are negative except what is noted above in HPI  Physical Exam: BP (!) 143/88   Pulse 76   Constitutional:  Alert and oriented, No acute distress. HEENT: [place] AT, moist mucus membranes.  Trachea midline, no masses. Cardiovascular: No clubbing, cyanosis, or edema. Respiratory: Normal respiratory effort, no increased work of breathing. GI: Abdomen is soft, nontender, nondistended, no abdominal masses GU: No CVA tenderness.  Lymph: No cervical or inguinal lymphadenopathy. Skin: No rashes, bruises or suspicious lesions. Neurologic: Grossly intact, no focal deficits, moving all 4 extremities. Psychiatric: Normal mood and affect.  Laboratory Data: Lab Results  Component Value Date   WBC 5.9 05/22/2020   HGB 15.1 05/22/2020   HCT 46.1 05/22/2020   MCV 93.7 05/22/2020   PLT 181 05/22/2020    Lab Results  Component Value Date   CREATININE 0.94 05/22/2020    No  results found for: "PSA"  No results found for: "TESTOSTERONE"  Lab Results  Component Value Date   HGBA1C 6.3 (H) 05/21/2020    Urinalysis    Component Value Date/Time   APPEARANCEUR Clear 09/21/2022 1527   GLUCOSEU 3+ (A) 09/21/2022 1527   BILIRUBINUR Negative 09/21/2022 1527   PROTEINUR Negative 09/21/2022 1527   NITRITE Negative 09/21/2022 1527   LEUKOCYTESUR Negative 09/21/2022 1527    Lab Results  Component Value Date   LABMICR Comment 09/21/2022    Pertinent Imaging:  No results found for this or any previous visit.  No results found for this or any previous visit.  No results found for this or any previous visit.  No results found for this or any previous visit.  No  results found for this or any previous visit.  No results found for this or any previous visit.  No results found for this or any previous visit.  No results found for this or any previous visit.   Assessment & Plan:    1. 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 Nocturia Decrease fluid intake within 2 hours of going to bed  3. Benign localized prostatic hyperplasia with lower urinary tract symptoms (LUTS) -improved after urolift   No follow-ups on file.  Wilkie Aye, MD  [facility] [place]

## 2023-09-18 NOTE — Patient Instructions (Signed)

## 2023-09-19 LAB — URINALYSIS, ROUTINE W REFLEX MICROSCOPIC
Bilirubin, UA: NEGATIVE
Glucose, UA: NEGATIVE
Leukocytes,UA: NEGATIVE
Nitrite, UA: NEGATIVE
Protein,UA: NEGATIVE
RBC, UA: NEGATIVE
Specific Gravity, UA: >1.03 — ABNORMAL HIGH (ref 1.005–1.030)
Urobilinogen, Ur: 0.2 mg/dL (ref 0.2–1.0)
pH, UA: 5.5 (ref 5.0–7.5)

## 2023-09-20 ENCOUNTER — Telehealth: Payer: Self-pay

## 2023-09-20 ENCOUNTER — Other Ambulatory Visit: Payer: Self-pay

## 2023-09-20 ENCOUNTER — Telehealth: Payer: Self-pay | Admitting: Urology

## 2023-09-20 DIAGNOSIS — N5201 Erectile dysfunction due to arterial insufficiency: Secondary | ICD-10-CM

## 2023-09-20 MED ORDER — SILDENAFIL CITRATE 20 MG PO TABS: 20.0000 mg | ORAL_TABLET | ORAL | 1 refills | Status: DC | PRN

## 2023-09-20 NOTE — Telephone Encounter (Signed)
FYI Rx sent 

## 2023-09-20 NOTE — Telephone Encounter (Signed)
 RX should be sent to [facility] [address] [place], it was sent to [facility]

## 2023-09-20 NOTE — Telephone Encounter (Signed)
 Medication prior authorization request received.  Completed PA request through [employer] for drug Sildenafil.  Approved: Pending

## 2024-01-27 ENCOUNTER — Other Ambulatory Visit: Payer: Self-pay | Admitting: Urology

## 2024-01-27 DIAGNOSIS — N5201 Erectile dysfunction due to arterial insufficiency: Secondary | ICD-10-CM

## 2024-04-11 DIAGNOSIS — E039 Hypothyroidism, unspecified: Secondary | ICD-10-CM | POA: Diagnosis not present

## 2024-04-11 DIAGNOSIS — E7800 Pure hypercholesterolemia, unspecified: Secondary | ICD-10-CM | POA: Diagnosis not present

## 2024-04-11 DIAGNOSIS — R350 Frequency of micturition: Secondary | ICD-10-CM | POA: Diagnosis not present

## 2024-04-11 DIAGNOSIS — Z299 Encounter for prophylactic measures, unspecified: Secondary | ICD-10-CM | POA: Diagnosis not present

## 2024-04-11 DIAGNOSIS — I10 Essential (primary) hypertension: Secondary | ICD-10-CM | POA: Diagnosis not present

## 2024-04-11 DIAGNOSIS — E119 Type 2 diabetes mellitus without complications: Secondary | ICD-10-CM | POA: Diagnosis not present

## 2024-04-11 DIAGNOSIS — M542 Cervicalgia: Secondary | ICD-10-CM | POA: Diagnosis not present

## 2024-05-23 DIAGNOSIS — Z79899 Other long term (current) drug therapy: Secondary | ICD-10-CM | POA: Diagnosis not present

## 2024-05-23 DIAGNOSIS — Z125 Encounter for screening for malignant neoplasm of prostate: Secondary | ICD-10-CM | POA: Diagnosis not present

## 2024-05-23 DIAGNOSIS — E039 Hypothyroidism, unspecified: Secondary | ICD-10-CM | POA: Diagnosis not present

## 2024-05-23 DIAGNOSIS — Z0000 Encounter for general adult medical examination without abnormal findings: Secondary | ICD-10-CM | POA: Diagnosis not present

## 2024-05-23 DIAGNOSIS — Z1331 Encounter for screening for depression: Secondary | ICD-10-CM | POA: Diagnosis not present

## 2024-05-23 DIAGNOSIS — Z299 Encounter for prophylactic measures, unspecified: Secondary | ICD-10-CM | POA: Diagnosis not present

## 2024-05-23 DIAGNOSIS — R1032 Left lower quadrant pain: Secondary | ICD-10-CM | POA: Diagnosis not present

## 2024-05-23 DIAGNOSIS — I10 Essential (primary) hypertension: Secondary | ICD-10-CM | POA: Diagnosis not present

## 2024-05-23 DIAGNOSIS — E7800 Pure hypercholesterolemia, unspecified: Secondary | ICD-10-CM | POA: Diagnosis not present

## 2024-05-31 DIAGNOSIS — R1032 Left lower quadrant pain: Secondary | ICD-10-CM | POA: Diagnosis not present

## 2024-05-31 DIAGNOSIS — K5730 Diverticulosis of large intestine without perforation or abscess without bleeding: Secondary | ICD-10-CM | POA: Diagnosis not present

## 2024-05-31 DIAGNOSIS — N200 Calculus of kidney: Secondary | ICD-10-CM | POA: Diagnosis not present

## 2024-06-04 DIAGNOSIS — R748 Abnormal levels of other serum enzymes: Secondary | ICD-10-CM | POA: Diagnosis not present

## 2024-06-07 DIAGNOSIS — Z1211 Encounter for screening for malignant neoplasm of colon: Secondary | ICD-10-CM | POA: Diagnosis not present

## 2024-09-18 ENCOUNTER — Ambulatory Visit: Admitting: Urology
